# Patient Record
Sex: Male | Born: 1970 | Race: White | Hispanic: No | Marital: Single | Smoking: Never smoker
Health system: Southern US, Community
[De-identification: ages and names within clinical notes are randomized; demographics above are authoritative.]

## PROBLEM LIST (undated history)

## (undated) DIAGNOSIS — R569 Unspecified convulsions: Secondary | ICD-10-CM

## (undated) DIAGNOSIS — F32A Depression, unspecified: Secondary | ICD-10-CM

## (undated) DIAGNOSIS — F329 Major depressive disorder, single episode, unspecified: Secondary | ICD-10-CM

## (undated) DIAGNOSIS — M549 Dorsalgia, unspecified: Secondary | ICD-10-CM

## (undated) DIAGNOSIS — K5792 Diverticulitis of intestine, part unspecified, without perforation or abscess without bleeding: Secondary | ICD-10-CM

## (undated) DIAGNOSIS — K219 Gastro-esophageal reflux disease without esophagitis: Secondary | ICD-10-CM

## (undated) DIAGNOSIS — F419 Anxiety disorder, unspecified: Secondary | ICD-10-CM

## (undated) DIAGNOSIS — E785 Hyperlipidemia, unspecified: Secondary | ICD-10-CM

## (undated) DIAGNOSIS — G4730 Sleep apnea, unspecified: Secondary | ICD-10-CM

## (undated) HISTORY — DX: Hyperlipidemia, unspecified: E78.5

## (undated) HISTORY — DX: Anxiety disorder, unspecified: F41.9

## (undated) HISTORY — DX: Sleep apnea, unspecified: G47.30

## (undated) HISTORY — DX: Unspecified convulsions: R56.9

## (undated) HISTORY — PX: COLONOSCOPY: SHX174

## (undated) HISTORY — DX: Diverticulitis of intestine, part unspecified, without perforation or abscess without bleeding: K57.92

## (undated) HISTORY — PX: JOINT REPLACEMENT: SHX530

## (undated) HISTORY — DX: Major depressive disorder, single episode, unspecified: F32.9

## (undated) HISTORY — PX: UPPER GASTROINTESTINAL ENDOSCOPY: SHX188

## (undated) HISTORY — DX: Depression, unspecified: F32.A

---

## 1999-08-22 ENCOUNTER — Ambulatory Visit (HOSPITAL_COMMUNITY): Admission: RE | Admit: 1999-08-22 | Discharge: 1999-08-22 | Payer: Self-pay | Admitting: Family Medicine

## 1999-08-22 ENCOUNTER — Encounter: Payer: Self-pay | Admitting: Family Medicine

## 2001-03-04 ENCOUNTER — Ambulatory Visit: Admission: RE | Admit: 2001-03-04 | Discharge: 2001-03-04 | Payer: Self-pay | Admitting: Family Medicine

## 2001-03-04 ENCOUNTER — Encounter: Payer: Self-pay | Admitting: Family Medicine

## 2001-07-21 ENCOUNTER — Ambulatory Visit (HOSPITAL_BASED_OUTPATIENT_CLINIC_OR_DEPARTMENT_OTHER): Admission: RE | Admit: 2001-07-21 | Discharge: 2001-07-21 | Payer: Self-pay | Admitting: Family Medicine

## 2002-05-08 HISTORY — PX: OTHER SURGICAL HISTORY: SHX169

## 2003-02-18 ENCOUNTER — Ambulatory Visit (HOSPITAL_BASED_OUTPATIENT_CLINIC_OR_DEPARTMENT_OTHER): Admission: RE | Admit: 2003-02-18 | Discharge: 2003-02-19 | Payer: Self-pay | Admitting: Orthopedic Surgery

## 2003-09-10 ENCOUNTER — Ambulatory Visit (HOSPITAL_COMMUNITY): Admission: RE | Admit: 2003-09-10 | Discharge: 2003-09-10 | Payer: Self-pay | Admitting: Family Medicine

## 2003-11-30 ENCOUNTER — Ambulatory Visit (HOSPITAL_COMMUNITY): Admission: RE | Admit: 2003-11-30 | Discharge: 2003-11-30 | Payer: Self-pay | Admitting: Gastroenterology

## 2004-06-06 ENCOUNTER — Ambulatory Visit (HOSPITAL_COMMUNITY): Admission: RE | Admit: 2004-06-06 | Discharge: 2004-06-06 | Payer: Self-pay | Admitting: Family Medicine

## 2005-06-14 ENCOUNTER — Ambulatory Visit (HOSPITAL_COMMUNITY): Admission: RE | Admit: 2005-06-14 | Discharge: 2005-06-14 | Payer: Self-pay | Admitting: Gastroenterology

## 2007-05-10 ENCOUNTER — Ambulatory Visit: Payer: Self-pay | Admitting: Gastroenterology

## 2007-05-13 ENCOUNTER — Ambulatory Visit: Payer: Self-pay | Admitting: Gastroenterology

## 2007-05-13 DIAGNOSIS — K5900 Constipation, unspecified: Secondary | ICD-10-CM | POA: Insufficient documentation

## 2007-05-13 DIAGNOSIS — K210 Gastro-esophageal reflux disease with esophagitis: Secondary | ICD-10-CM

## 2007-05-13 DIAGNOSIS — K5732 Diverticulitis of large intestine without perforation or abscess without bleeding: Secondary | ICD-10-CM | POA: Insufficient documentation

## 2007-05-13 DIAGNOSIS — K449 Diaphragmatic hernia without obstruction or gangrene: Secondary | ICD-10-CM | POA: Insufficient documentation

## 2007-06-17 ENCOUNTER — Ambulatory Visit: Payer: Self-pay | Admitting: Gastroenterology

## 2009-05-25 ENCOUNTER — Ambulatory Visit: Payer: Self-pay | Admitting: Gastroenterology

## 2009-05-25 DIAGNOSIS — R1012 Left upper quadrant pain: Secondary | ICD-10-CM

## 2009-05-26 ENCOUNTER — Ambulatory Visit (HOSPITAL_COMMUNITY): Admission: RE | Admit: 2009-05-26 | Discharge: 2009-05-26 | Payer: Self-pay | Admitting: Gastroenterology

## 2009-05-26 LAB — CONVERTED CEMR LAB
ALT: 23 units/L (ref 0–53)
AST: 20 units/L (ref 0–37)
Albumin: 4.3 g/dL (ref 3.5–5.2)
Basophils Relative: 0.8 % (ref 0.0–3.0)
GFR calc non Af Amer: 88 mL/min (ref >60)
Glucose, Bld: 79 mg/dL (ref 70–99)
Lymphocytes Relative: 29.2 % (ref 12.0–46.0)
MCHC: 33 g/dL (ref 30.0–36.0)
MCV: 93.2 fL (ref 78.0–100.0)
Monocytes Relative: 9.3 % (ref 3.0–12.0)
Neutro Abs: 3.8 10*3/uL (ref 1.4–7.7)
Neutrophils Relative %: 57.6 % (ref 43.0–77.0)
Potassium: 4.8 meq/L (ref 3.5–5.1)
Sed Rate: 12 mm/hr (ref 0–22)
TSH: 2.29 microintl units/mL (ref 0.35–5.50)
Total Protein: 7.1 g/dL (ref 6.0–8.3)

## 2009-06-28 ENCOUNTER — Ambulatory Visit: Payer: Self-pay | Admitting: Gastroenterology

## 2010-06-09 NOTE — Assessment & Plan Note (Signed)
  Review of gastrointestinal problems: 1. Chronic constipation:  colonoscopy January 2009 showing left side diverticulosis.  No adenomas.  Symptoms somewhat relieved with MiraLax daily.   2. Chronic abdominal discomfort and bloating, intermittent dysphagia. Status post EGD January 2009 showing a 3 cm hiatal hernia, reflux related esophagitis and a lower esophageal Schatzki's type ring dilated to 20 mm with a CRE balloon. Ultrasound January, 2011: normal gallbladder, no gallstones, ?hyperechoic liver.  Labs in January, 2011: normal CBC, normal complete metabolic profile, normal thyroid testing, normal sedimentation rate. 3. routine risk for colon cancer, next colonoscopy January 2019    History of Present Illness Visit Type: Follow-up Visit Primary GI MD: Rob Bunting MD Primary Provider: Holley Bouche, MD Chief Complaint: constipation History of Present Illness:     whom I last saw abouit 1 month ago.  He started to have loose stools shortly after starting miralax/citrucel after 3 days.  Stopped and became somwhat constipated again.  HE prophylactally due to community wide stomach flu (with has affected his GF's daughter and GF, mainly just coughing, nausea).  Had brief, sharp, stabbing epigastric pain.  No associated with eating. Did not last very long.  Has never happened before.  Had no associate N/V.            Current Medications (verified): 1)  Miralax  Powd (Polyethylene Glycol 3350) .... As Needed 2)  Prevacid 15 Mg Cpdr (Lansoprazole) .... Take 1 - 2 Capsules By Mouth Daily As Needed 3)  Tramadol Hcl 50 Mg Tabs (Tramadol Hcl) .... At Bedtime 4)  Lyrica 25 Mg Caps (Pregabalin) .... Two Times A Day 5)  Etodolac 300 Mg Caps (Etodolac) .... Take One Pill Twice A Day As Needed  Allergies (verified): No Known Drug Allergies  Vital Signs:  Patient profile:   40 year old male Height:      70 inches Weight:      229.50 pounds BMI:     33.05 Pulse rate:   72 / minute Pulse  rhythm:   regular BP sitting:   120 / 84  (left arm) Cuff size:   regular  Vitals Entered By: June McMurray CMA Duncan Dull) (June 28, 2009 9:49 AM)  Physical Exam  Additional Exam:  Constitutional: generally well appearing Psychiatric: alert and oriented times 3 Abdomen: soft, non-tender, non-distended, normal bowel sounds    Impression & Recommendations:  Problem # 1:  constipation, somewhat improved I recommended he stay on something on a daily basis such as MiraLax, fiber supplements. In my mind this is better than trying to treat constipation after it is already become significant.  Problem # 2:  epigastric pain unclear etiology however his pain only occurred once. He has no gallstones on recent ultrasound and his CBC complete metabolic profile was normal. He does take NSAIDs and so perhaps this is NSAID related. He will call his pain becomes more frequent or more bothersome.  Patient Instructions: 1)  Try taking smaller doses of miralax and fiber supplment (every day).  Start with 1/2 dose of each, every day. 2)  Call Dr. Christella Hartigan if this isn't effective or if the epigastric pains occur more frequently (would consider HIDA scan of GB function). 3)  A copy of this information will be sent to Dr. Holley Bouche. 4)  The medication list was reviewed and reconciled.  All changed / newly prescribed medications were explained.  A complete medication list was provided to the patient / caregiver.

## 2010-06-09 NOTE — Assessment & Plan Note (Signed)
Review of gastrointestinal problems: 1. Chronic constipation:  colonoscopy January 2009 showing left side diverticulosis.  No adenomas.  Symptoms somewhat relieved with MiraLax daily.   2. Chronic abdominal discomfort and bloating, intermittent dysphagia. Status post EGD January 2009 showing a 3 cm hiatal hernia, reflux related esophagitis and a lower esophageal Schatzki's type ring dilated to 20 mm with a CRE balloon.  3. routine risk for colon cancer, next colonoscopy January 2019   History of Present Illness Visit Type: Follow-up Visit Primary GI MD: Rob Bunting MD Primary Provider: Holley Bouche, MD Chief Complaint: abdominal pain LUQ History of Present Illness:     very pleasant 40 year old man whom I last saw two years ago.  At that point he was put on a trial of low dose Reglan, this helped somewhat.  he was scheduled to see me again in 4-5 weeks after that but he did not show for that appointment and I have not heard from him since then.   Currently he is most bothered by bloating sensation that occur usually right after he eats.  Will have knot like feeling in LUQ, bloating.  Will not get much nausea. No vomitting.  He takes prevacid daily, if he skips then he will get pyrosis.  has gained 11 pounds since last visit.  eats 3 meals a day.  Doesn't matter what he eats, can have the bloating sensation.  Will take miralax as needed only.  May have tried fiber in the past, not sure if it helped.             Current Medications (verified): 1)  Miralax  Powd (Polyethylene Glycol 3350) .... As Needed 2)  Prevacid 15 Mg Cpdr (Lansoprazole) .... Take 1 - 2 Capsules By Mouth Daily As Needed 3)  Tramadol Hcl 50 Mg Tabs (Tramadol Hcl) .... At Bedtime 4)  Lyrica 25 Mg Caps (Pregabalin) .... Two Times A Day  Allergies (verified): No Known Drug Allergies  Vital Signs:  Patient profile:   40 year old male Height:      70 inches Weight:      231.13 pounds BMI:     33.28 Pulse  rate:   88 / minute Pulse rhythm:   regular BP sitting:   118 / 98  (left arm) Cuff size:   regular  Vitals Entered By: June McMurray CMA Duncan Dull) (May 25, 2009 10:24 AM)  Physical Exam  Additional Exam:  Constitutional: generally well appearing Psychiatric: alert and oriented times 3 Abdomen: soft, non-tender, non-distended, normal bowel sounds    Impression & Recommendations:  Problem # 1:  CONSTIPATION (ICD-564.00) I did not mention above that he does have to push and strain to move his bowels, usually. He has had a colonoscopy and that does not need to be repeated. I recommended he start taking MiraLax and fiber supplements both on a daily basis. Constipation can contribute or cause upper GI symptoms such as his, bloating, dyspepsia. It will be interesting to see if his symptoms resolve after 4-5 weeks of working on his constipation.  Problem # 2:  dyspepsia possibly constipation related, he has not had any imaging studies and so I will set him up with an abdominal ultrasound. He will also get a repeat set of labs including a CBC, complete metabolic profile, sedimentation rate, thyroid testing.  Other Orders: TLB-CBC Platelet - w/Differential (85025-CBCD) TLB-CMP (Comprehensive Metabolic Pnl) (80053-COMP) TLB-TSH (Thyroid Stimulating Hormone) (84443-TSH) TLB-Sedimentation Rate (ESR) (85652-ESR)  Patient Instructions: 1)  You will get lab test(s)  done today (cbc, cmet, esr, tsh).  2)  Continue taking miralax one capful a day. 3)  You should begin taking citrucel powder fiber supplement (orange flavor).  Start with a small spoonful and increase this over 1 week to a full, heaping spoonful daily.  You may notice some bloating when you first start the fiber, but that usually resolves after a few days. 4)  You will be scheduled for an ultrasound for abd discomfort (LUQ). 5)  Return to see Dr. Christella Hartigan in 4-5 weeks. 6)  The medication list was reviewed and reconciled.  All changed /  newly prescribed medications were explained.  A complete medication list was provided to the patient / caregiver.  Appended Document: Orders Update/US    Clinical Lists Changes  Problems: Added new problem of ABDOMINAL PAIN, LEFT UPPER QUADRANT (ICD-789.02) Orders: Added new Test order of Ultrasound Abdomen (UAS) - Signed

## 2010-09-20 NOTE — Assessment & Plan Note (Signed)
[facility]                         GASTROENTEROLOGY OFFICE NOTE   NAME:Daniel Morrison, Daniel Morrison                            MRN:          [id number]  DATE:05/10/2007                            DOB:          02-22-1971    REFERRING PHYSICIAN:  Melida Quitter, M.D.   REASON FOR REFERRAL:  Dr. Tiburcio Pea asked me to evaluate Daniel Morrison in  consultation regarding chronic abdominal discomfort, bloating,  constipation.   HISTORY OF PRESENT ILLNESS:  Daniel Morrison is a very pleasant 40 year old man  who has had at least 2 to 3 years of bloating sensation.  He has chronic  GERD symptoms as well, pyrosis, which are usually well controlled by  Protonix, which he takes once to twice a day.  He did have an EGD by Dr.  Dorena Cookey in July 2008, and it showed a large hiatal hernia that  measured 4 to 5 cm, as well as an inflamed lower esophageal ring that he  dilated.  Daniel Morrison believes that his intermittent dysphagia improved  after the dilation.   He also has rather chronic constipation.  He will have to strain to move  his bowels.  He has seen blood on intermittent occasions.  He never has  diarrhea.   REVIEW OF SYSTEMS:  Notable for a 10-pound weight gain in the past year.  Otherwise, essentially normal and available on his nursing intake sheet.   PAST MEDICAL HISTORY:  GERD and hiatal hernia, as above.   CURRENT MEDICATIONS:  1. Protonix 40 mg once to twice daily.  2. Goody's Powders he takes on as needed basis, which is about every      other day.  3. MiraLax he takes 1 scoop every day or 2.   ALLERGIES:  No known drug allergies.   SOCIAL HISTORY:  Divorced.  No children.  Works as an Oncologist.  Does not smoke cigarettes.  Drinks 2 to 3 beers a night.  Drinks 64  ounces of Diet Dr. Reino Kent a day and 40 ounces of coffee a day.   FAMILY HISTORY:  His maternal grandfather had colon cancer.  No first  degree relatives with colon cancer.   PHYSICAL EXAM:  Height 5  feet 11 inches, 220 pounds.  Blood pressure  126/84, pulse 80.  CONSTITUTIONAL:  Generally well-appearing.  NEUROLOGIC:  Alert and oriented x3.  EYES:  Extraocular movements intact.  MOUTH:  Oropharynx moist.  No lesions.  NECK:  Supple.  No lymphadenopathy.  CARDIOVASCULAR:  Heart is regular rate and rhythm.  LUNGS:  Clear to auscultation bilaterally.  ABDOMEN:  Soft, nontender, nondistended.  Normal bowel sounds.  EXTREMITIES:  No lower extremity edema.  SKIN:  No rashes or lesions on the visible extremities.   ASSESSMENT AND PLAN:  A 40 year old man with large hiatal hernia,  chronic gastroesophageal reflux disease symptoms, chronic constipation,  bloating discomfort.   First, for his constipation, I recommended he continue the MiraLax and  take it on an every day basis rather than just every other day or so.  He can feel free to  increase to 1-1/2 scoops a day on a daily basis.  He  has never had colonoscopy, but has seen bright red blood per rectum, so  I will arrange for him to have full colonoscopy performed at his soonest  convenience as well.   As per his upper GI symptoms, he has a large hiatal hernia that is  likely contributing to his GERD symptoms.  He also drinks multiple  caffeinated beverages a day, 2 to 3 alcoholic beverages a day, and  Goody's Powders every other day that has caffeine and NSAID in it.  I  recommended very strongly that he cut his caffeine intake back to at  least just 1 to 2 caffeinated beverages a day and also try to cut his  alcohol intake down to just 1 beer nightly.  I think these will go a  long way to helping his GERD symptoms and bloating.  He does have  intermittent dysphagia, and has had an esophageal stricture with a ring  dilated in 2005, so I will arrange to have an EGD again at the same time  as his colonoscopy.  I see no reason for any further blood tests or  imaging studies other than those mentioned above.     Rachael Fee,  MD  Electronically Signed    DPJ/MedQ  DD: 05/10/2007  DT: 05/10/2007  Job #: [id number]   cc:   Melida Quitter, M.D.

## 2010-09-20 NOTE — Assessment & Plan Note (Signed)
[facility]                         GASTROENTEROLOGY OFFICE NOTE   NAME:Daniel Morrison, Daniel Morrison                            MRN:          [id number]  DATE:06/17/2007                            DOB:          1970/12/17    GASTROINTESTINAL PROBLEM LIST:  1. Chronic constipation status post colonoscopy January 2009 showing      left side diverticulosis.  No adenomas.  Symptoms somewhat relieved      with MiraLax daily.  2. Chronic abdominal discomfort and bloating, intermittent dysphagia.      Status post EGD January 2009 showing a 3 cm hiatal hernia, reflux      related esophagitis and a lower esophageal Schatzki's type ring      dilated to 20 mm with a CRE balloon.   INTERVAL HISTORY:  I last saw Daniel Morrison at the time of his colonoscopy  and upper endoscopy  about five weeks ago.  The results of those are  summarized above.  Since then, he still does have intermittent  dysphagia like symptoms, but now when he is describing them, it seems  more like he will have regurgitation one to two hours after eating a  meal.  It can be a small amount of relatively undigested food.  He does  have bloating and mild, chronic nausea as well.   CURRENT MEDICATIONS:  Protonix 40 mg once a day.  Some days takes twice  a day.   PHYSICAL EXAMINATION:  Weight 219 pounds, which is down 1 pound since  his last visit.  Blood pressure 120/78, pulse 64.  CONSTITUTIONAL:  Generally well appearing.  ABDOMEN:  Soft, nontender, nondistended.  Normal bowel sounds.  EXTREMITIES:  No lower extremity edema.   ASSESSMENT/PLAN:  [age]-year-old man with mild, chronic  constipation, intermittent vomiting, history of Schatzki's ring and mild  gastroesophageal reflux disease related esophagitis.   For his constipation, he has never really tried fiber supplements and  recommended he try that instead of MiraLax as MiraLax can be quite  expensive and with fiber supplements, he could get just sa  much relief.  The way he is describing his upper gastrointestinal symptoms, it is more  of a regurgitation now, and I wonder if he has minor gastroparesis.  He  is not diabetic and I see no reason for him to have slow stomach  emptying, but I will try him on a low dose trial of Reglan 5 mg t.i.d.  that I will call in a prescription for one month's worth with one  refill.  He will return to see me in four weeks' time and sooner if  needed.  I see no reason for any further blood tests or imaging studies  at this point.     Rachael Fee, MD  Electronically Signed   DPJ/MedQ  DD: 06/17/2007  DT: 06/18/2007  Job #: [id number]   cc:   Melida Quitter, M.D.

## 2010-09-23 NOTE — Op Note (Signed)
NAMEROWYN, SPILDE NO.:  [id number]   MEDICAL RECORD NO.:  [id number]                   PATIENT TYPE:  AMB   LOCATION:  DSC                                  FACILITY:  [facility]   PHYSICIAN:  Loreta Ave, M.D.              DATE OF BIRTH:  [birth year]   DATE OF PROCEDURE:  02/18/2003  DATE OF DISCHARGE:                                 OPERATIVE REPORT   PREOPERATIVE DIAGNOSES:  1. Torn medial collateral ligament, anterior cruciate ligament and partial     lateral collateral ligament tear, right knee.  2. Lateral meniscal tear.  3. Closed treatment of medial and lateral collateral injuries with     significant healing.  4. Persistent anterolateral rotary instability.   POSTOPERATIVE DIAGNOSES:  1. Torn medial collateral ligament, anterior cruciate ligament and partial     lateral collateral ligament tear, right knee.  2. Lateral meniscal tear.  3. Closed treatment of medial and lateral collateral injuries with     significant healing.  4. Persistent anterolateral rotary instability.  5. Healed lateral collateral ligament injury and 80% healing medial     collateral ligament injury.   PROCEDURE:  1. Right knee examination under anesthesia.  2. Arthroscopy.  3. Partial lateral meniscectomy.  4. Arthroscopic endoscopic anterior cruciate ligament reconstruction.  5. Patellar tendon allograft.  6. Bone-tendon-bone with bioabsorbable screw fixation and notchplasty.   SURGEON:  Loreta Ave, M.D.   ASSISTANT:  Arlys John D. Petrarca, P.A.-C.   ANESTHESIA:  General.   ESTIMATED BLOOD LOSS:  Minimal.   TOURNIQUET TIME:  One hour.   SPECIMENS:  None.   CULTURES:  None.   COMPLICATIONS:  None.   DRESSING:  Soft compressive with Bledsoe brace in full extension.   DESCRIPTION OF PROCEDURE:  The patient was brought to the operating room and  after adequate anesthesia had been obtained, the right knee examined.  Still  open slightly with  soft end point, medial collateral ligament testing and  flexion and extension.  No longer grossly unstable as he was after his  injury.  Lateral collateral ligament stable.  Positive Lachman, positive  drawer, positive pivot shift.  Patellofemoral joint had a little medial  laxity but basically stable after medial retinacular injury had been treated  closed.  Full extension.  With gentle motion, achieved full flexion.  Tourniquet applied.  Leg prepped and draped in the usual sterile fashion.  Exsanguinated with elevation and Esmarch.  Tourniquet inflated to 350 mmHg.   Three portals were created; one superolateral, one each medial and lateral  parapatellar.  Inflow catheter introduced.  Knee distended.  Arthroscope  introduced, knee inspected.  Some grade 2 scuffing changes on the patella  treated with chondroplasty.  A little lateral tracking, no tethering.  Sufficient stability.  Remaining articular cartilage intact.  Irreparable  large flap tear posterior half lateral meniscus, saucerized  out, tapered in  smoothly.  Soft tissue injury to the popliteal tendon and posterolateral  corner sufficient healing with no open injuries or instability there.  Medial meniscus intact.  The peripheral medial meniscal injury had healed.  ACL completely deficient.  ACL debrided.  Notchplasty with a shaver and high  speed bur.   Allograft prepared for 10 mm tunnels.  Small incision next to the tibial  tubercle, K-wire driven and then overdrilled with a 10 mm reamer to create  the tibial tunnel in the ACL footprint.  The femoral guide inserted across  the tibial/femoral notch back to the cortex of the femur.  K-wire driven and  overdrilled with a reamer for appropriate depth for the graft and pegs.  Tunnels assessed, found to be in good position.  Debris cleared throughout.   Two pin passers inserted across both tunnels and out through a stab wound  anterolateral thigh.  Nitinol wire brought in the  medial portal and out  through the femoral tunnel.  Graft attached to a two pin passer and pulled  across the knee seating the pegs well in the tibia and in the femur.  Firmly  fixed in both tunnels over nitinol wire with appropriate tensioning.  At  completion, excellent capturing and fixation of the graft above and below.  Full motion without impingement on the graft through full motion.  Negative  Lachman and drawer.  Marked improvement of stability.   Entire knee examined, no other findings appreciated.  Instruments and fluid  removed.  Portals of the knee injected with Marcaine.  Portals were closed  with 4-0 nylon.  Incision injected with Marcaine and closed subcutaneous and  subcuticular Vicryl.  Sterile compressive dressing applied.  Tourniquet  deflated and removed.  Bledsoe brace applied in full extension.  Anesthesia  reversed.   Brought to recovery room.  Tolerated surgery well.  No complications.                                               Loreta Ave, M.D.    DFM/MEDQ  D:  02/18/2003  T:  02/18/2003  Job:  [id number]

## 2010-09-23 NOTE — Op Note (Signed)
Daniel Morrison, Daniel Morrison                               ACCOUNT NO.:  [id number]   MEDICAL RECORD NO.:  [id number]                   PATIENT TYPE:  AMB   LOCATION:  ENDO                                 FACILITY:  [facility]   PHYSICIAN:  John C. Madilyn Fireman, M.D.                 DATE OF BIRTH:  Jul 21, 1970   DATE OF PROCEDURE:  11/30/2003  DATE OF DISCHARGE:                                 OPERATIVE REPORT   PROCEDURE:  Esophagogastroduodenoscopy with esophageal dilatation.   INDICATION FOR PROCEDURE:  Postprandial dyspepsia and possible solid food  dysphagia.   PROCEDURE:  The patient was placed in the left lateral decubitus position  and placed on the pulse monitor with a continuous low-flow oxygen delivered  by nasal cannula.  He was sedated with 75 mcg IV fentanyl and 8 mg IV  Versed.  The Olympus video endoscope was advanced under direct vision into  the oropharynx and esophagus.  The esophagus was somewhat tortuous but of  normal caliber with the squamocolumnar line at 36 cm above a 4-5 cm hiatal  hernia.  There was circumferential inflammation and narrowing at the  squamocolumnar line consistent with an inflamed lower esophageal ring.  There were no discrete ulcers but circumferential inflammation with some  exudate.  There was no resistance to the passage of the scope beyond the  ring.  The stomach was entered and a small amount of liquid secretions was  suctioned from the fundus.  Retroflexed view of the cardia confirmed the  hiatal hernia and was otherwise unremarkable.  It was difficult for the  patient to hold air, and there was free reflux seen into the esophagus  during the procedure.  The fundus, body, antrum, and pylorus all appeared  normal.  The duodenum was entered and both the bulb and second portion were  well-inspected and appeared to be within normal limits.  A Savary guidewire  was placed through the endoscope channel and the scope withdrawn.  A 16 mm  Savary dilator was passed  over the guidewire with minimal resistance and no  blood seen on withdrawal.  The dilator was removed together with a wire and  the patient returned to the recovery room in stable condition.  He tolerated  the procedure well, and there were no immediate complications.   IMPRESSION:  1. Inflamed lower esophageal ring.  2. Large hiatal hernia with reflux.   PLAN:  Will increase proton pump inhibitor to b.i.d. dosing, reinforce  lifestyle modifications for reflux, and observe response to dilatation.  Follow-up in my office in six weeks.                                               John C. Madilyn Fireman, M.D.    JCH/MEDQ  D:  11/30/2003  T:  11/30/2003  Job:  [id number]   cc:   Meredith Staggers, M.D.  [address], [address]  [place]  [place] [address]  Fax: [phone]

## 2011-03-01 ENCOUNTER — Emergency Department (HOSPITAL_BASED_OUTPATIENT_CLINIC_OR_DEPARTMENT_OTHER)
Admission: EM | Admit: 2011-03-01 | Discharge: 2011-03-01 | Disposition: A | Discharge status: Home / Self Care | Payer: 59 | Attending: Emergency Medicine | Admitting: Emergency Medicine

## 2011-03-01 ENCOUNTER — Emergency Department (INDEPENDENT_AMBULATORY_CARE_PROVIDER_SITE_OTHER): Payer: 59

## 2011-03-01 ENCOUNTER — Encounter: Payer: Self-pay | Admitting: *Deleted

## 2011-03-01 DIAGNOSIS — N3090 Cystitis, unspecified without hematuria: Secondary | ICD-10-CM | POA: Insufficient documentation

## 2011-03-01 DIAGNOSIS — K5792 Diverticulitis of intestine, part unspecified, without perforation or abscess without bleeding: Secondary | ICD-10-CM

## 2011-03-01 DIAGNOSIS — R300 Dysuria: Secondary | ICD-10-CM

## 2011-03-01 DIAGNOSIS — R109 Unspecified abdominal pain: Secondary | ICD-10-CM | POA: Insufficient documentation

## 2011-03-01 DIAGNOSIS — K219 Gastro-esophageal reflux disease without esophagitis: Secondary | ICD-10-CM | POA: Insufficient documentation

## 2011-03-01 DIAGNOSIS — K5732 Diverticulitis of large intestine without perforation or abscess without bleeding: Secondary | ICD-10-CM | POA: Insufficient documentation

## 2011-03-01 DIAGNOSIS — K5730 Diverticulosis of large intestine without perforation or abscess without bleeding: Secondary | ICD-10-CM

## 2011-03-01 HISTORY — DX: Gastro-esophageal reflux disease without esophagitis: K21.9

## 2011-03-01 HISTORY — DX: Dorsalgia, unspecified: M54.9

## 2011-03-01 LAB — URINALYSIS, ROUTINE W REFLEX MICROSCOPIC
Leukocytes, UA: NEGATIVE
Nitrite: NEGATIVE
Specific Gravity, Urine: 1.021 (ref 1.005–1.030)
Urobilinogen, UA: 0.2 mg/dL (ref 0.0–1.0)
pH: 5.5 (ref 5.0–8.0)

## 2011-03-01 LAB — COMPREHENSIVE METABOLIC PANEL
ALT: 24 U/L (ref 0–53)
Alkaline Phosphatase: 52 U/L (ref 39–117)
CO2: 25 mEq/L (ref 19–32)
Calcium: 9.7 mg/dL (ref 8.4–10.5)
Creatinine, Ser: 0.8 mg/dL (ref 0.50–1.35)
GFR calc Af Amer: >90 mL/min (ref >90)
Glucose, Bld: 86 mg/dL (ref 70–99)
Potassium: 3.7 mEq/L (ref 3.5–5.1)

## 2011-03-01 LAB — CBC
HCT: 39.9 % (ref 39.0–52.0)
MCHC: 35.8 g/dL (ref 30.0–36.0)
MCV: 86.6 fL (ref 78.0–100.0)
RDW: 12.6 % (ref 11.5–15.5)

## 2011-03-01 LAB — DIFFERENTIAL
Monocytes Absolute: 0.8 10*3/uL (ref 0.1–1.0)
Monocytes Relative: 9 % (ref 3–12)

## 2011-03-01 LAB — LIPASE, BLOOD: Lipase: 28 U/L (ref 11–59)

## 2011-03-01 MED ORDER — MORPHINE SULFATE 4 MG/ML IJ SOLN
4.0000 mg | Freq: Once | INTRAMUSCULAR | Status: AC
  Administered 2011-03-01: 4 mg via INTRAVENOUS
  Filled 2011-03-01: qty 1

## 2011-03-01 MED ORDER — METRONIDAZOLE 500 MG PO TABS: 500.0000 mg | ORAL_TABLET | Freq: Two times a day (BID) | ORAL | Status: AC

## 2011-03-01 MED ORDER — ONDANSETRON HCL 4 MG/2ML IJ SOLN
4.0000 mg | Freq: Once | INTRAMUSCULAR | Status: AC
  Administered 2011-03-01: 4 mg via INTRAVENOUS
  Filled 2011-03-01: qty 2

## 2011-03-01 MED ORDER — IOHEXOL 300 MG/ML  SOLN
100.0000 mL | Freq: Once | INTRAMUSCULAR | Status: AC | PRN
  Administered 2011-03-01: 100 mL via INTRAVENOUS

## 2011-03-01 MED ORDER — CIPROFLOXACIN HCL 500 MG PO TABS: 500.0000 mg | ORAL_TABLET | Freq: Two times a day (BID) | ORAL | Status: AC

## 2011-03-01 NOTE — ED Notes (Signed)
Pt c/o lower abd pain with painful urination

## 2011-03-01 NOTE — ED Provider Notes (Signed)
History     CSN: [id number] Arrival date & time: 03/01/2011  7:04 PM   First MD Initiated Contact with Patient 03/01/11 1907      Chief Complaint  Patient presents with  . Dysuria    (Consider location/radiation/quality/duration/timing/severity/associated sxs/prior treatment) HPI Comments: Pt states that he has been having generalized lower abdominal pain with urination  Patient is a 40 y.o. male presenting with abdominal pain. The history is provided by the patient. No language interpreter was used.  Abdominal Pain The primary symptoms of the illness include abdominal pain, nausea, diarrhea and dysuria. The primary symptoms of the illness do not include vomiting. The current episode started 2 days ago. The onset of the illness was gradual. The problem has been gradually worsening.  The dysuria is associated with frequency. The dysuria is not associated with urgency.  Additional symptoms associated with the illness include frequency. Symptoms associated with the illness do not include constipation, urgency or back pain.    Past Medical History  Diagnosis Date  . GERD (gastroesophageal reflux disease)   . Back pain     Past Surgical History  Procedure Date  . Joint replacement     History reviewed. No pertinent family history.  History  Substance Use Topics  . Smoking status: Never Smoker   . Smokeless tobacco: Not on file  . Alcohol Use: 3.0 oz/week    5 Cans of beer per week      Review of Systems  Gastrointestinal: Positive for nausea, abdominal pain and diarrhea. Negative for vomiting and constipation.  Genitourinary: Positive for dysuria and frequency. Negative for urgency.  Musculoskeletal: Negative for back pain.  All other systems reviewed and are negative.    Allergies  Review of patient's allergies indicates no known allergies.  Home Medications   Current Outpatient Rx  Name Route Sig Dispense Refill  . HYDROCODONE-ACETAMINOPHEN 5-325 MG PO TABS  Oral Take 1 tablet by mouth every 6 (six) hours as needed.      Marland Kitchen OMEPRAZOLE 20 MG PO CPDR Oral Take 20 mg by mouth daily.        BP 132/93  Pulse 75  Temp(Src) 98.3 F (36.8 C) (Oral)  Resp 16  Ht 5\' 11"  (1.803 m)  Wt 198 lb (89.812 kg)  BMI 27.62 kg/m2  SpO2 100%  Physical Exam  Nursing note and vitals reviewed. Constitutional: He is oriented to person, place, and time. He appears well-developed and well-nourished.  Cardiovascular: Normal rate and regular rhythm.   Pulmonary/Chest: Effort normal and breath sounds normal.  Abdominal: Soft. There is tenderness in the right lower quadrant and left lower quadrant.  Musculoskeletal: Normal range of motion.  Neurological: He is alert and oriented to person, place, and time.  Skin: Skin is warm and dry.    ED Course  Procedures (including critical care time)  Labs Reviewed  URINALYSIS, ROUTINE W REFLEX MICROSCOPIC - Abnormal; Notable for the following:    Ketones, ur 15 (*)    All other components within normal limits  CBC  DIFFERENTIAL  COMPREHENSIVE METABOLIC PANEL  LIPASE, BLOOD   Ct Abdomen Pelvis W Contrast  03/01/2011  *RADIOLOGY REPORT*  Clinical Data: Abdominal pain and dysuria.  CT ABDOMEN AND PELVIS WITH CONTRAST  Technique:  Multidetector CT imaging of the abdomen and pelvis was performed following the standard protocol during bolus administration of intravenous contrast.  Contrast: OMNIPAQUE IOHEXOL 300 MG/ML IV SOLN  Comparison: 05/26/2009 ultrasound.  05/1934 CT  Findings: Clear lung bases.  Size upper normal without pericardial or pleural effusion.  Mild atrophy of the lateral segment left liver lobe is unchanged and nonspecific.  Normal spleen, stomach, pancreas, gallbladder, biliary tract, adrenal glands, kidneys. No retroperitoneal or retrocrural adenopathy.  Sigmoid diverticula with muscular hypertrophy.  Possible pericolonic edema at the descending sigmoid junction, including on image 63. Normal terminal  ileum and appendix.  Normal small bowel without abdominal ascites.  Tiny fat containing left inguinal hernia. No pelvic adenopathy. The bladder wall appears mildly irregular on image 80.  Normal prostate without significant free pelvic fluid.  Right-sided transitional lumbosacral anatomy.  IMPRESSION:  1.  Mild bladder wall irregularity suspected.  Given history of dysuria, this could represent cystitis. 2.  Sigmoid diverticulosis with equivocal pericolonic edema (versus prominent pericolonic vasculature).  Correlate with any symptoms to suggest minimal uncomplicated diverticulitis.  Of note, this appearance was similar on 06/06/2004 CT.  Original Report Authenticated By: Consuello Bossier, M.D.     1. Diverticulitis   2. Cystitis       MDM  Urine is clean:will treat for a early diverticulitis as pt is having diarrhea and left sided pain:cipro will also treat cystitis:urine not showing infection        Teressa Lower, NP 03/01/11 2042

## 2011-03-02 NOTE — ED Provider Notes (Signed)
Medical screening examination/treatment/procedure(s) were performed by non-physician practitioner and as supervising physician I was immediately available for consultation/collaboration.   Dorien Bessent A. Jia Dottavio, MD 03/02/11 0002 

## 2011-04-06 ENCOUNTER — Ambulatory Visit
Admission: RE | Admit: 2011-04-06 | Discharge: 2011-04-06 | Disposition: A | Discharge status: Home / Self Care | Payer: 59 | Source: Ambulatory Visit | Attending: Family Medicine | Admitting: Family Medicine

## 2011-04-06 ENCOUNTER — Other Ambulatory Visit: Payer: Self-pay | Admitting: Family Medicine

## 2011-04-06 DIAGNOSIS — M25561 Pain in right knee: Secondary | ICD-10-CM

## 2011-12-13 ENCOUNTER — Ambulatory Visit
Admission: RE | Admit: 2011-12-13 | Discharge: 2011-12-13 | Disposition: A | Discharge status: Home / Self Care | Payer: 59 | Source: Ambulatory Visit | Attending: Family Medicine | Admitting: Family Medicine

## 2011-12-13 ENCOUNTER — Other Ambulatory Visit: Payer: Self-pay | Admitting: Family Medicine

## 2011-12-13 ENCOUNTER — Other Ambulatory Visit: Payer: 59

## 2011-12-13 DIAGNOSIS — R1031 Right lower quadrant pain: Secondary | ICD-10-CM

## 2011-12-13 DIAGNOSIS — R1032 Left lower quadrant pain: Secondary | ICD-10-CM

## 2011-12-13 MED ORDER — IOHEXOL 300 MG/ML  SOLN
100.0000 mL | Freq: Once | INTRAMUSCULAR | Status: AC | PRN
  Administered 2011-12-13: 100 mL via INTRAVENOUS

## 2011-12-24 ENCOUNTER — Emergency Department (HOSPITAL_BASED_OUTPATIENT_CLINIC_OR_DEPARTMENT_OTHER): Payer: 59

## 2011-12-24 ENCOUNTER — Emergency Department (HOSPITAL_BASED_OUTPATIENT_CLINIC_OR_DEPARTMENT_OTHER)
Admission: EM | Admit: 2011-12-24 | Discharge: 2011-12-24 | Disposition: A | Discharge status: Home / Self Care | Payer: 59 | Attending: Emergency Medicine | Admitting: Emergency Medicine

## 2011-12-24 ENCOUNTER — Encounter (HOSPITAL_BASED_OUTPATIENT_CLINIC_OR_DEPARTMENT_OTHER): Payer: Self-pay | Admitting: *Deleted

## 2011-12-24 DIAGNOSIS — K219 Gastro-esophageal reflux disease without esophagitis: Secondary | ICD-10-CM

## 2011-12-24 DIAGNOSIS — R109 Unspecified abdominal pain: Secondary | ICD-10-CM

## 2011-12-24 LAB — CBC WITH DIFFERENTIAL/PLATELET
Eosinophils Absolute: 0.1 10*3/uL (ref 0.0–0.7)
Hemoglobin: 14.4 g/dL (ref 13.0–17.0)
Lymphocytes Relative: 30 % (ref 12–46)
MCH: 31.4 pg (ref 26.0–34.0)
MCHC: 35.7 g/dL (ref 30.0–36.0)
MCV: 88 fL (ref 78.0–100.0)
Neutro Abs: 4.1 10*3/uL (ref 1.7–7.7)
Neutrophils Relative %: 59 % (ref 43–77)
Platelets: 277 10*3/uL (ref 150–400)
RBC: 4.58 MIL/uL (ref 4.22–5.81)

## 2011-12-24 LAB — COMPREHENSIVE METABOLIC PANEL
ALT: 45 U/L (ref 0–53)
AST: 31 U/L (ref 0–37)
Albumin: 3.9 g/dL (ref 3.5–5.2)
Alkaline Phosphatase: 48 U/L (ref 39–117)
BUN: 8 mg/dL (ref 6–23)
Calcium: 9.2 mg/dL (ref 8.4–10.5)
Chloride: 103 mEq/L (ref 96–112)
GFR calc Af Amer: >90 mL/min (ref >90)
GFR calc non Af Amer: >90 mL/min (ref >90)
Sodium: 139 mEq/L (ref 135–145)
Total Protein: 6.9 g/dL (ref 6.0–8.3)

## 2011-12-24 LAB — URINALYSIS, ROUTINE W REFLEX MICROSCOPIC
Glucose, UA: NEGATIVE mg/dL
Hgb urine dipstick: NEGATIVE
Ketones, ur: NEGATIVE mg/dL
Leukocytes, UA: NEGATIVE
pH: 5.5 (ref 5.0–8.0)

## 2011-12-24 MED ORDER — IOHEXOL 300 MG/ML  SOLN
100.0000 mL | Freq: Once | INTRAMUSCULAR | Status: AC | PRN
  Administered 2011-12-24: 100 mL via INTRAVENOUS

## 2011-12-24 MED ORDER — GI COCKTAIL ~~LOC~~
ORAL | Status: AC
  Administered 2011-12-24: 30 mL via ORAL
  Filled 2011-12-24: qty 30

## 2011-12-24 MED ORDER — FENTANYL CITRATE 0.05 MG/ML IJ SOLN
50.0000 ug | Freq: Once | INTRAMUSCULAR | Status: AC
  Administered 2011-12-24: 50 ug via INTRAVENOUS
  Filled 2011-12-24: qty 2

## 2011-12-24 MED ORDER — SUCRALFATE 1 GM/10ML PO SUSP: 1.0000 g | Freq: Four times a day (QID) | ORAL | Status: DC

## 2011-12-24 MED ORDER — SODIUM CHLORIDE 0.9 % IV BOLUS (SEPSIS)
1000.0000 mL | Freq: Once | INTRAVENOUS | Status: AC
  Administered 2011-12-24: 1000 mL via INTRAVENOUS

## 2011-12-24 MED ORDER — GI COCKTAIL ~~LOC~~
30.0000 mL | Freq: Once | ORAL | Status: AC
  Administered 2011-12-24: 30 mL via ORAL

## 2011-12-24 MED ORDER — ONDANSETRON HCL 4 MG/2ML IJ SOLN
4.0000 mg | Freq: Once | INTRAMUSCULAR | Status: AC
  Administered 2011-12-24: 4 mg via INTRAVENOUS
  Filled 2011-12-24: qty 2

## 2011-12-24 MED ORDER — TRAMADOL HCL 50 MG PO TABS: 50.0000 mg | ORAL_TABLET | Freq: Four times a day (QID) | ORAL | Status: AC | PRN

## 2011-12-24 MED ORDER — IOHEXOL 300 MG/ML  SOLN
40.0000 mL | Freq: Once | INTRAMUSCULAR | Status: AC | PRN
  Administered 2011-12-24: 40 mL via ORAL

## 2011-12-24 NOTE — ED Provider Notes (Signed)
History     CSN: [id number]  Arrival date & time 12/24/11  0130   First MD Initiated Contact with Patient 12/24/11 0136      Chief Complaint  Patient presents with  . Flank Pain    (Consider location/radiation/quality/duration/timing/severity/associated sxs/prior treatment) Patient is a 41 y.o. male presenting with flank pain and abdominal pain. The history is provided by the patient. No language interpreter was used.  Flank Pain This is a new problem. The current episode started more than 1 week ago (3 weeks). The problem occurs constantly. The problem has not changed since onset.Associated symptoms include abdominal pain. Pertinent negatives include no chest pain, no headaches and no shortness of breath. Nothing aggravates the symptoms. He has tried nothing for the symptoms. The treatment provided no relief.  Abdominal Pain The primary symptoms of the illness include abdominal pain and nausea. The primary symptoms of the illness do not include fever, shortness of breath, vomiting or diarrhea. The current episode started more than 2 days ago (3 weeks). The onset of the illness was gradual. The problem has not changed since onset. The abdominal pain began more than 2 days ago. The pain came on gradually. The abdominal pain has been unchanged since its onset. The abdominal pain does not radiate. The abdominal pain is relieved by nothing. Exacerbated by: nothing.  The illness is associated with recent antibiotic use. The patient has not had a change in bowel habit. Risk factors: none. Additional symptoms associated with the illness include heartburn. Symptoms associated with the illness do not include diaphoresis, frequency or back pain. Associated symptoms comments: Also constant x 3 weeks. Significant associated medical issues include diverticulitis.    Past Medical History  Diagnosis Date  . GERD (gastroesophageal reflux disease)   . Back pain     Past Surgical History  Procedure Date   . Joint replacement     History reviewed. No pertinent family history.  History  Substance Use Topics  . Smoking status: Never Smoker   . Smokeless tobacco: Not on file  . Alcohol Use: 3.0 oz/week    5 Cans of beer per week      Review of Systems  Constitutional: Negative for fever and diaphoresis.  HENT: Negative for neck pain and neck stiffness.   Respiratory: Negative for shortness of breath.   Cardiovascular: Negative for chest pain, palpitations and leg swelling.  Gastrointestinal: Positive for heartburn, nausea and abdominal pain. Negative for vomiting and diarrhea.  Genitourinary: Positive for flank pain. Negative for frequency.  Musculoskeletal: Negative for back pain.  Neurological: Positive for light-headedness. Negative for headaches.  All other systems reviewed and are negative.    Allergies  Review of patient's allergies indicates no known allergies.  Home Medications   Current Outpatient Rx  Name Route Sig Dispense Refill  . ALPRAZOLAM 0.25 MG PO TABS Oral Take 0.25 mg by mouth daily as needed. For anxiety     . CELEXA PO Oral Take 1 tablet by mouth at bedtime.      Marland Kitchen HYDROCODONE-ACETAMINOPHEN 5-325 MG PO TABS Oral Take 1 tablet by mouth every 6 (six) hours as needed. For pain    . OMEPRAZOLE 20 MG PO CPDR Oral Take 20 mg by mouth daily.        BP 130/94  Pulse 61  Temp 97.6 F (36.4 C) (Oral)  Resp 20  SpO2 98%  Physical Exam  Constitutional: He is oriented to person, place, and time. He appears well-developed and well-nourished. No distress.  HENT:  Head: Normocephalic and atraumatic.  Mouth/Throat: Oropharynx is clear and moist.  Eyes: Conjunctivae are normal. Pupils are equal, round, and reactive to light.  Neck: Normal range of motion. Neck supple.  Cardiovascular: Normal rate and regular rhythm.   Pulmonary/Chest: Effort normal and breath sounds normal. He has no wheezes. He has no rales.  Abdominal: Soft. Bowel sounds are normal. He  exhibits no distension. There is no tenderness. There is no rebound and no guarding.  Musculoskeletal: Normal range of motion.  Neurological: He is alert and oriented to person, place, and time.  Skin: Skin is warm and dry.  Psychiatric: He has a normal mood and affect.    ED Course  Procedures (including critical care time)   Labs Reviewed  CBC WITH DIFFERENTIAL  COMPREHENSIVE METABOLIC PANEL  TROPONIN I  URINALYSIS, ROUTINE W REFLEX MICROSCOPIC   No results found.   No diagnosis found.    MDM   Date: 12/24/2011  Rate: 65  Rhythm: normal sinus rhythm  QRS Axis: normal  Intervals: normal  ST/T Wave abnormalities: normal  Conduction Disutrbances: none  Narrative Interpretation: unremarkable   In the setting of > 8 hours of indigestion/GERD sx one negative EKG and troponin is sufficient to r/o ACS.  Symptoms are clearly consistent with his previously diagnosed diverticulitis and h/o gerd.  Will change gerd meds and have patient follow up with family doctor for ongoing care of abdominal pain         Lakitha Gordy K Kennedi Lizardo-Rasch, MD 12/24/11 [phone]

## 2011-12-24 NOTE — ED Notes (Signed)
Pt presents to ED today with flank pain, dizziness, and indigestion for the last 3 weeks.  Pt has been seen here for same and states sx have not decreased.

## 2011-12-25 ENCOUNTER — Telehealth: Payer: Self-pay | Admitting: Gastroenterology

## 2011-12-25 LAB — URINE CULTURE: Culture: NO GROWTH

## 2011-12-26 NOTE — Telephone Encounter (Signed)
Pt was seen in ER for left side abd pain, dx diverticulitis.  Dr Azucena Cecil wants pt seen first afternoon appt.  Talbert Forest will fax notes attn to [facility]

## 2012-01-01 ENCOUNTER — Ambulatory Visit: Payer: 59 | Admitting: Nurse Practitioner

## 2012-01-02 ENCOUNTER — Ambulatory Visit: Payer: 59 | Admitting: Gastroenterology

## 2012-01-22 ENCOUNTER — Ambulatory Visit (INDEPENDENT_AMBULATORY_CARE_PROVIDER_SITE_OTHER): Payer: 59 | Admitting: Gastroenterology

## 2012-01-22 ENCOUNTER — Encounter: Payer: Self-pay | Admitting: Gastroenterology

## 2012-01-22 VITALS — BP 96/70 | HR 72 | Ht 71.0 in | Wt 218.2 lb

## 2012-01-22 DIAGNOSIS — K5792 Diverticulitis of intestine, part unspecified, without perforation or abscess without bleeding: Secondary | ICD-10-CM

## 2012-01-22 DIAGNOSIS — R1110 Vomiting, unspecified: Secondary | ICD-10-CM

## 2012-01-22 DIAGNOSIS — K5732 Diverticulitis of large intestine without perforation or abscess without bleeding: Secondary | ICD-10-CM

## 2012-01-22 NOTE — Progress Notes (Signed)
Review of gastrointestinal problems:  1. Chronic constipation: colonoscopy January 2009 showing left side diverticulosis. No adenomas. Symptoms somewhat relieved with MiraLax daily.  2. Chronic abdominal discomfort and bloating, intermittent dysphagia. Status post EGD January 2009 showing a 3 cm hiatal hernia, reflux related esophagitis and a lower esophageal Schatzki's type ring dilated to 20 mm with a CRE balloon. Ultrasound January, 2011: normal gallbladder, no gallstones, ?hyperechoic liver. Labs in January, 2011: normal CBC, normal complete metabolic profile, normal thyroid testing, normal sedimentation rate.  3. routine risk for colon cancer, next colonoscopy January 2019   HPI: This is a   very pleasant 41 year old man whom I last saw several years ago.  He has vomiting, hours after a meal.  Regurgitating. Several times a day.  Several hours after a meal, regurgitates.  No nausea.   Was having left sided abdominal pain.  WBC was normal. Had a CT scan see below.  Completed cipro/flagyl. Was not febrile. The pains improved.    He thinks he had it in 2012.    He takes an otc stool softners.  Sometimes helps. Takes miralax periodically.  Takes 1-2 norco a day.  No bleeding.  Overall weight is a bit up lately after losing weight last year.  CT scan abdomen and pelvis with IV and oral contrast August 2013 showed mid descending colon inflammation with pericolonic stranding consistent with mild diverticulitis. He had a repeat CT scan about a week later showing improved inflammation.  CBC around that time was normal.  So was CMET.   Review of systems: Pertinent positive and negative review of systems were noted in the above HPI section. Complete review of systems was performed and was otherwise normal.    Past Medical History  Diagnosis Date  . GERD (gastroesophageal reflux disease)   . Back pain   . Diverticulitis     Past Surgical History  Procedure Date  . Joint replacement       Current Outpatient Prescriptions  Medication Sig Dispense Refill  . ALPRAZolam (XANAX) 0.25 MG tablet Take 0.25 mg by mouth daily as needed. For anxiety       . HYDROcodone-acetaminophen (NORCO) 5-325 MG per tablet Take 1 tablet by mouth every 6 (six) hours as needed. For pain      . omeprazole (PRILOSEC) 20 MG capsule Take 20 mg by mouth daily.        . sucralfate (CARAFATE) 1 GM/10ML suspension Take 10 mLs (1 g total) by mouth 4 (four) times daily.  420 mL  0    Allergies as of 01/22/2012 - Review Complete 01/22/2012  Allergen Reaction Noted  . Ciprofloxacin  01/22/2012    Family History  Problem Relation Age of Onset  . Diabetes Brother     x2  . Diabetes Maternal Grandfather     History   Social History  . Marital Status: Single    Spouse Name: N/A    Number of Children: N/A  . Years of Education: N/A   Occupational History  . 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 Smoking status: Never Smoker   . Smokeless tobacco: Never Used  . Alcohol Use: 3.0 oz/week    5 Cans of beer per week  . Drug Use: No  . Sexually Active: No   Other Topics Concern  . Not on file   Social History Narrative  . No narrative on file       Physical Exam: BP 96/70  Pulse 72  Ht 5\' 11"  (1.803 m)  Wt 218 lb 3.2 oz (98.975 kg)  BMI 30.43 kg/m2 Constitutional: generally well-appearing Psychiatric: alert and oriented x3 Eyes: extraocular movements intact Mouth: oral pharynx moist, no lesions Neck: supple no lymphadenopathy Cardiovascular: heart regular rate and rhythm Lungs: clear to auscultation bilaterally Abdomen: soft, nontender, nondistended, no obvious ascites, no peritoneal signs, normal bowel sounds Extremities: no lower extremity edema bilaterally Skin: no lesions on visible extremities    Assessment and plan: 41 y.o. male with  resolved left sided diverticulitis, recent regurgitation, abdominal pains  He has chronic  constipation and we are going to work on this to see if it can help prevent further episodes of diverticulitis. He will start taking MiraLax on a daily basis rather than just when necessary and will return to see me in 4-5 weeks to report on his symptoms. He has regurgitation but no dysphagia, no weight loss. He is on narcotic pain medicines to a day that can contribute to his constipation and can also slow the stomach. I am suspicious that he might have gastroparesis based on his symptoms. He'll get a gastric emptying scan to see if this is indeed true.

## 2012-01-22 NOTE — Patient Instructions (Addendum)
Miralax one scoop every single day for long term control of constipation. Narcotic pain meds can cause constipation and a slow stomach. Try to take as little as possible. Gastric emptying scan for vomiting.  Please arrive on 01/31/12 at 1245 pm for a 1 pm appointment at [facility].  Have nothing to eat or drink for 6 hours prior to the appointment. Return in 4-5 weeks to report on your symptoms.

## 2012-01-31 ENCOUNTER — Encounter (HOSPITAL_COMMUNITY)
Admission: RE | Admit: 2012-01-31 | Discharge: 2012-01-31 | Disposition: A | Discharge status: Home / Self Care | Payer: 59 | Source: Ambulatory Visit | Attending: Gastroenterology | Admitting: Gastroenterology

## 2012-01-31 DIAGNOSIS — R1110 Vomiting, unspecified: Secondary | ICD-10-CM

## 2012-01-31 DIAGNOSIS — R112 Nausea with vomiting, unspecified: Secondary | ICD-10-CM | POA: Insufficient documentation

## 2012-01-31 MED ORDER — TECHNETIUM TC 99M SULFUR COLLOID
2.2000 | Freq: Once | INTRAVENOUS | Status: AC | PRN
  Administered 2012-01-31: 2.2 via INTRAVENOUS

## 2012-03-01 ENCOUNTER — Ambulatory Visit: Payer: 59 | Admitting: Gastroenterology

## 2012-04-16 ENCOUNTER — Other Ambulatory Visit: Payer: Self-pay | Admitting: Family Medicine

## 2012-04-16 DIAGNOSIS — M5416 Radiculopathy, lumbar region: Secondary | ICD-10-CM

## 2012-04-18 ENCOUNTER — Ambulatory Visit
Admission: RE | Admit: 2012-04-18 | Discharge: 2012-04-18 | Disposition: A | Discharge status: Home / Self Care | Payer: BC Managed Care – PPO | Source: Ambulatory Visit | Attending: Family Medicine | Admitting: Family Medicine

## 2012-04-18 DIAGNOSIS — M5416 Radiculopathy, lumbar region: Secondary | ICD-10-CM

## 2012-05-08 HISTORY — PX: BACK SURGERY: SHX140

## 2012-09-17 ENCOUNTER — Encounter: Payer: Self-pay | Admitting: Gastroenterology

## 2012-09-17 ENCOUNTER — Ambulatory Visit (INDEPENDENT_AMBULATORY_CARE_PROVIDER_SITE_OTHER): Payer: BC Managed Care – PPO | Admitting: Gastroenterology

## 2012-09-17 VITALS — BP 120/84 | HR 72 | Ht 70.0 in | Wt 231.0 lb

## 2012-09-17 DIAGNOSIS — R1110 Vomiting, unspecified: Secondary | ICD-10-CM

## 2012-09-17 MED ORDER — METOCLOPRAMIDE HCL 5 MG PO TABS: 5.0000 mg | ORAL_TABLET | Freq: Two times a day (BID) | ORAL | Status: DC

## 2012-09-17 NOTE — Progress Notes (Signed)
Review of gastrointestinal problems:  1. Chronic constipation: colonoscopy January 2009 showing left side diverticulosis. No adenomas. Symptoms somewhat relieved with MiraLax daily.  2. Chronic abdominal discomfort and bloating, intermittent dysphagia. Status post EGD January 2009 showing a 3 cm hiatal hernia, reflux related esophagitis and a lower esophageal Schatzki's type ring dilated to 20 mm with a CRE balloon. Ultrasound January, 2011: normal gallbladder, no gallstones, ?hyperechoic liver. Labs in January, 2011: normal CBC, normal complete metabolic profile, normal thyroid testing, normal sedimentation rate.  GES 2013 was essentially normal. 3. routine risk for colon cancer, next colonoscopy January 2019 4. Mild acute diverticulitis: CT scan abdomen and pelvis with IV and oral contrast August 2013 showed mid descending colon inflammation with pericolonic stranding consistent with mild diverticulitis. He had a repeat CT scan about a week later showing improved inflammation. CBC around that time was normal. So was CMET.  Treated with oral ABX well.     HPI: This is a  very pleasant 42 year old man whom I last saw about a year ago.  Has been having left sided abdominal pains.  Takes miralax about every day.  Still can be a bit constipated.  Can regurgitate food from several hours later.   This occurs throughout the day.  Can have nausea thorughout the day.  Takes 1 at bedtime.    Eats 3 meals per day, no snacking.  Overall weight is up a bit lately.   Past Medical History  Diagnosis Date  . GERD (gastroesophageal reflux disease)   . Back pain   . Diverticulitis     Past Surgical History  Procedure Laterality Date  . Joint replacement    . Back surgery  05/2012  . Acl replacement  2004     right knee    Current Outpatient Prescriptions  Medication Sig Dispense Refill  . ALPRAZolam (XANAX) 0.25 MG tablet Take 0.25 mg by mouth daily as needed. For anxiety       .  HYDROcodone-acetaminophen (NORCO) 5-325 MG per tablet Take 1 tablet by mouth every 6 (six) hours as needed. For pain      . omeprazole (PRILOSEC) 20 MG capsule Take 20 mg by mouth daily.        . sucralfate (CARAFATE) 1 GM/10ML suspension Take 10 mLs (1 g total) by mouth 4 (four) times daily.  420 mL  0   No current facility-administered medications for this visit.    Allergies as of 09/17/2012 - Review Complete 09/17/2012  Allergen Reaction Noted  . Ciprofloxacin  01/22/2012    Family History  Problem Relation Age of Onset  . Diabetes Brother     x2  . Diabetes Maternal Grandfather   . Stomach cancer Maternal Grandfather     History   Social History  . Marital Status: Single    Spouse Name: N/A    Number of Children: N/A  . Years of Education: N/A   Occupational History  . 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 Smoking status: Never Smoker   . Smokeless tobacco: Never Used  . Alcohol Use: 3.0 oz/week    5 Cans of beer per week  . Drug Use: No  . Sexually Active: No   Other Topics Concern  . Not on file   Social History Narrative  . No narrative on file      Physical Exam: There were no vitals taken for this visit. Constitutional: generally well-appearing Psychiatric: alert and oriented x3 Abdomen:  soft, nontender, nondistended, no obvious ascites, no peritoneal signs, normal bowel sounds     Assessment and plan: 42 y.o. 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 When he try him on low dose twice daily Reglan and he will return to see me in 2 months, sooner if needed. I did explain to him about  tardive dyskinesia;  this is a very uncommon but irreversible side effect of Reglan. He will be started at much lower doses than is known  to cause this. He is also quite a bit younger than patients who had had in the past.

## 2012-09-17 NOTE — Patient Instructions (Addendum)
Trial of reglan.  Take one pill at bedtime every night and one pill at lunch every day.  5mg  pills. Please return to see Dr. Christella Hartigan in 2 months to report on your symptoms.                                               We are excited to introduce MyChart, a new best-in-class service that provides you online access to important information in your electronic medical record. We want to make it easier for you to view your health information - all in one secure location - when and where you need it. We expect MyChart will enhance the quality of care and service we provide.  When you register for MyChart, you can:    View your test results.    Request appointments and receive appointment reminders via email.    Request medication renewals.    View your medical history, allergies, medications and immunizations.    Communicate with your physician's office through a password-protected site.    Conveniently print information such as your medication lists.  To find out if MyChart is right for you, please talk to a member of our clinical staff today. We will gladly answer your questions about this free health and wellness tool.  If you are age 26 or older and want a member of your family to have access to your record, you must provide written consent by completing a proxy form available at our office. Please speak to our clinical staff about guidelines regarding accounts for patients younger than age 41.  As you activate your MyChart account and need any technical assistance, please call the MyChart technical support line at [phone]) or email your question to [email or web address]. If you email your question(s), please include your name, a return phone number and the best time to reach you.  If you have non-urgent health-related questions, you can send a message to our office through MyChart at [email or web address]. If you have a medical emergency, call 911.  Thank you for  using MyChart as your new health and wellness resource!   MyChart licensed from [employer],  [id number]. Patents Pending.

## 2012-11-19 ENCOUNTER — Encounter: Payer: Self-pay | Admitting: Gastroenterology

## 2012-11-19 ENCOUNTER — Emergency Department (HOSPITAL_BASED_OUTPATIENT_CLINIC_OR_DEPARTMENT_OTHER)
Admission: EM | Admit: 2012-11-19 | Discharge: 2012-11-19 | Disposition: A | Discharge status: Home / Self Care | Payer: BC Managed Care – PPO | Attending: Emergency Medicine | Admitting: Emergency Medicine

## 2012-11-19 ENCOUNTER — Ambulatory Visit (INDEPENDENT_AMBULATORY_CARE_PROVIDER_SITE_OTHER): Payer: BC Managed Care – PPO | Admitting: Gastroenterology

## 2012-11-19 ENCOUNTER — Emergency Department (HOSPITAL_BASED_OUTPATIENT_CLINIC_OR_DEPARTMENT_OTHER): Payer: BC Managed Care – PPO

## 2012-11-19 ENCOUNTER — Encounter (HOSPITAL_BASED_OUTPATIENT_CLINIC_OR_DEPARTMENT_OTHER): Payer: Self-pay | Admitting: *Deleted

## 2012-11-19 VITALS — BP 110/68 | HR 72 | Ht 70.0 in | Wt 228.2 lb

## 2012-11-19 DIAGNOSIS — R3989 Other symptoms and signs involving the genitourinary system: Secondary | ICD-10-CM | POA: Insufficient documentation

## 2012-11-19 DIAGNOSIS — R109 Unspecified abdominal pain: Secondary | ICD-10-CM

## 2012-11-19 DIAGNOSIS — Z8719 Personal history of other diseases of the digestive system: Secondary | ICD-10-CM | POA: Insufficient documentation

## 2012-11-19 DIAGNOSIS — F329 Major depressive disorder, single episode, unspecified: Secondary | ICD-10-CM | POA: Insufficient documentation

## 2012-11-19 DIAGNOSIS — Z79899 Other long term (current) drug therapy: Secondary | ICD-10-CM | POA: Insufficient documentation

## 2012-11-19 DIAGNOSIS — K219 Gastro-esophageal reflux disease without esophagitis: Secondary | ICD-10-CM | POA: Insufficient documentation

## 2012-11-19 DIAGNOSIS — R1012 Left upper quadrant pain: Secondary | ICD-10-CM

## 2012-11-19 DIAGNOSIS — R110 Nausea: Secondary | ICD-10-CM

## 2012-11-19 DIAGNOSIS — F3289 Other specified depressive episodes: Secondary | ICD-10-CM | POA: Insufficient documentation

## 2012-11-19 LAB — URINALYSIS, ROUTINE W REFLEX MICROSCOPIC
Bilirubin Urine: NEGATIVE
Hgb urine dipstick: NEGATIVE
Ketones, ur: NEGATIVE mg/dL
Specific Gravity, Urine: 1.005 (ref 1.005–1.030)
Urobilinogen, UA: 0.2 mg/dL (ref 0.0–1.0)
pH: 5 (ref 5.0–8.0)

## 2012-11-19 LAB — BASIC METABOLIC PANEL
BUN: 14 mg/dL (ref 6–23)
Calcium: 9.9 mg/dL (ref 8.4–10.5)
Creatinine, Ser: 1.1 mg/dL (ref 0.50–1.35)
GFR calc non Af Amer: 81 mL/min — ABNORMAL LOW (ref >90)
Glucose, Bld: 114 mg/dL — ABNORMAL HIGH (ref 70–99)

## 2012-11-19 LAB — CBC WITH DIFFERENTIAL/PLATELET
Eosinophils Absolute: 0.1 10*3/uL (ref 0.0–0.7)
Eosinophils Relative: 2 % (ref 0–5)
Hemoglobin: 13.9 g/dL (ref 13.0–17.0)
Lymphs Abs: 2.3 10*3/uL (ref 0.7–4.0)
MCH: 31.4 pg (ref 26.0–34.0)
MCV: 88.9 fL (ref 78.0–100.0)
Monocytes Absolute: 0.9 10*3/uL (ref 0.1–1.0)
Monocytes Relative: 10 % (ref 3–12)
RBC: 4.42 MIL/uL (ref 4.22–5.81)

## 2012-11-19 MED ORDER — OXYCODONE-ACETAMINOPHEN 5-325 MG PO TABS: ORAL_TABLET | ORAL | Status: DC

## 2012-11-19 MED ORDER — OXYCODONE-ACETAMINOPHEN 5-325 MG PO TABS
2.0000 | ORAL_TABLET | Freq: Once | ORAL | Status: AC
  Administered 2012-11-19: 2 via ORAL
  Filled 2012-11-19 (×2): qty 2

## 2012-11-19 MED ORDER — ONDANSETRON HCL 4 MG PO TABS: 4.0000 mg | ORAL_TABLET | Freq: Two times a day (BID) | ORAL | Status: AC | PRN

## 2012-11-19 NOTE — ED Notes (Signed)
PA at bedside now  

## 2012-11-19 NOTE — ED Notes (Signed)
PA at bedside.

## 2012-11-19 NOTE — ED Notes (Addendum)
Pt c/o lower back pain x 1 week denies injury, " foul smelling urine"

## 2012-11-19 NOTE — Progress Notes (Signed)
Review of gastrointestinal problems:  1. Chronic constipation: colonoscopy January 2009 showing left side diverticulosis. No adenomas. Symptoms somewhat relieved with MiraLax daily.  2. Chronic abdominal discomfort and bloating, intermittent dysphagia. Status post EGD January 2009 showing a 3 cm hiatal hernia, reflux related esophagitis and a lower esophageal Schatzki's type ring dilated to 20 mm with a CRE balloon. Ultrasound January, 2011: normal gallbladder, no gallstones, ?hyperechoic liver. Labs in January, 2011: normal CBC, normal complete metabolic profile, normal thyroid testing, normal sedimentation rate. GES 2013 was essentially normal.  Summer 2014 continued abd discomforts, felt clinically to be related to mild gastroparesis from narcotics, trial of low dose reglan twice daily started. 3. routine risk for colon cancer, next colonoscopy January 2019  4. Mild acute diverticulitis: CT scan abdomen and pelvis with IV and oral contrast August 2013 showed mid descending colon inflammation with pericolonic stranding consistent with mild diverticulitis. He had a repeat CT scan about a week later showing improved inflammation. CBC around that time was normal. So was CMET. Treated with oral ABX well.   HPI: This is a   pleasant 42 year old man whom I saw about 2-3 months ago.  Trial of reglan 54m bid.  He only noticed a slight improvement in his symptoms.  He still has intermittent regurgitation of food. Nausea.  He has tried zofran in the past with some help. He has some left sided abd pains.  Overall his weight has been stable.   Past Medical History  Diagnosis Date  . GERD (gastroesophageal reflux disease)   . Back pain   . Diverticulitis   . Depression     Past Surgical History  Procedure Laterality Date  . Joint replacement    . Back surgery  05/2012  . Acl replacement  2004     right knee    Current Outpatient Prescriptions  Medication Sig Dispense Refill  . ALPRAZolam (XANAX)  0.25 MG tablet Take 0.25 mg by mouth daily as needed. For anxiety       . buPROPion (WELLBUTRIN XL) 300 MG 24 hr tablet Take 300 mg by mouth daily.      Marland Kitchen HYDROcodone-acetaminophen (NORCO) 5-325 MG per tablet Take 1 tablet by mouth every 6 (six) hours as needed. For pain      . metoCLOPramide (REGLAN) 5 MG tablet Take 1 tablet (5 mg total) by mouth 2 (two) times daily. Take one a bedtime and one at lunchtime  60 tablet  2  . omeprazole (PRILOSEC) 20 MG capsule Take 20 mg by mouth daily.        . sucralfate (CARAFATE) 1 GM/10ML suspension Take 10 mLs (1 g total) by mouth 4 (four) times daily.  420 mL  0   No current facility-administered medications for this visit.    Allergies as of 11/19/2012 - Review Complete 11/19/2012  Allergen Reaction Noted  . Ciprofloxacin  01/22/2012    Family History  Problem Relation Age of Onset  . Diabetes Brother     x2  . Diabetes Maternal Grandfather   . Stomach cancer Maternal Grandfather     History   Social History  . Marital Status: Single    Spouse Name: N/A    Number of Children: N/A  . Years of Education: N/A   Occupational History  . 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 Smoking status: Never Smoker   . Smokeless tobacco: Never Used  . Alcohol Use: 3.0 oz/week    5  Cans of beer per week  . Drug Use: No  . Sexually Active: No   Other Topics Concern  . Not on file   Social History Narrative  . No narrative on file      Physical Exam: BP 110/68  Pulse 72  Ht 5\' 10"  (1.778 m)  Wt 228 lb 3.2 oz (103.511 kg)  BMI 32.74 kg/m2 Constitutional: generally well-appearing Psychiatric: alert and oriented x3 Abdomen: soft, nontender, nondistended, no obvious ascites, no peritoneal signs, normal bowel sounds     Assessment and plan: 42 y.o. male with chronic GI complaints, recent nausea and left upper quadrant discomforts  Trial of Reglan did not help inside of removed that from his medicine  list. He had EGD about 5 years ago given his persistent upper GI-like symptoms I would like to repeat that now. It should be done with MAC given his chronic narcotic use. I have also called in a prescription for Zofran since it seems to help with his nausea.

## 2012-11-19 NOTE — ED Notes (Signed)
Patient transported to CT via stretcher.

## 2012-11-19 NOTE — ED Notes (Signed)
Pt was seen by GI this am.

## 2012-11-19 NOTE — Patient Instructions (Addendum)
You will be set up for an upper endoscopy with MAC for left sided abd pains, nausea. Stop the reglan. this was removed from your med list already. New prescription for zofran was called in.                                               We are excited to introduce MyChart, a new best-in-class service that provides you online access to important information in your electronic medical record. We want to make it easier for you to view your health information - all in one secure location - when and where you need it. We expect MyChart will enhance the quality of care and service we provide.  When you register for MyChart, you can:    View your test results.    Request appointments and receive appointment reminders via email.    Request medication renewals.    View your medical history, allergies, medications and immunizations.    Communicate with your physician's office through a password-protected site.    Conveniently print information such as your medication lists.  To find out if MyChart is right for you, please talk to a member of our clinical staff today. We will gladly answer your questions about this free health and wellness tool.  If you are age 43 or older and want a member of your family to have access to your record, you must provide written consent by completing a proxy form available at our office. Please speak to our clinical staff about guidelines regarding accounts for patients younger than age 54.  As you activate your MyChart account and need any technical assistance, please call the MyChart technical support line at [phone]) or email your question to [email or web address]. If you email your question(s), please include your name, a return phone number and the best time to reach you.  If you have non-urgent health-related questions, you can send a message to our office through MyChart at [email or web address]. If you have a medical emergency, call  911.  Thank you for using MyChart as your new health and wellness resource!   MyChart licensed from [employer],  [id number]. Patents Pending.

## 2012-11-19 NOTE — ED Provider Notes (Signed)
History    CSN: [id number] Arrival date & time 11/19/12  1928  First MD Initiated Contact with Patient 11/19/12 2011     Chief Complaint  Patient presents with  . Back Pain   (Consider location/radiation/quality/duration/timing/severity/associated sxs/prior Treatment) HPI Comments: 42 y.o. Male with PMHx of diverticulosis, lower back pain, and GERD presents today complaining of left flank pain. Onset a week. Gradually worsening. Worse today. Radiating at times around to left groin area. Intermittent. Rates pain at 5/10. Described as sharp. Admits foul smelling urine. Denies fever, nausea, vomiting, dysuria, hematuria, bowel/bladder incontinence.   Patient is a 42 y.o. male presenting with flank pain.  Flank Pain Pertinent negatives include no diaphoresis, nausea, neck pain, rash or vomiting.   Past Medical History  Diagnosis Date  . GERD (gastroesophageal reflux disease)   . Back pain   . Diverticulitis   . Depression    Past Surgical History  Procedure Laterality Date  . Joint replacement    . Back surgery  05/2012  . Acl replacement  2004     right knee   Family History  Problem Relation Age of Onset  . Diabetes Brother     x2  . Diabetes Maternal Grandfather   . Stomach cancer Maternal Grandfather    History  Substance Use Topics  . Smoking status: Never Smoker   . Smokeless tobacco: Never Used  . Alcohol Use: 3.0 oz/week    5 Cans of beer per week    Review of Systems  Constitutional: Negative for diaphoresis.  HENT: Negative for neck pain and neck stiffness.   Eyes: Negative for visual disturbance.  Respiratory: Negative for apnea, chest tightness and shortness of breath.   Cardiovascular: Negative for palpitations.  Gastrointestinal: Negative for nausea, vomiting, diarrhea and constipation.  Genitourinary: Positive for flank pain.       Left flank pain  Musculoskeletal: Negative for gait problem.  Skin: Negative for rash.  Neurological: Negative for  dizziness and light-headedness.    Allergies  Ciprofloxacin  Home Medications   Current Outpatient Rx  Name  Route  Sig  Dispense  Refill  . ALPRAZolam (XANAX) 0.25 MG tablet   Oral   Take 0.25 mg by mouth daily as needed. For anxiety          . buPROPion (WELLBUTRIN XL) 300 MG 24 hr tablet   Oral   Take 300 mg by mouth daily.         Marland Kitchen HYDROcodone-acetaminophen (NORCO) 5-325 MG per tablet   Oral   Take 1 tablet by mouth every 6 (six) hours as needed. For pain         . omeprazole (PRILOSEC) 20 MG capsule   Oral   Take 20 mg by mouth daily.           . ondansetron (ZOFRAN) 4 MG tablet   Oral   Take 1 tablet (4 mg total) by mouth every 12 (twelve) hours as needed for nausea.   30 tablet   4   . oxyCODONE-acetaminophen (PERCOCET/ROXICET) 5-325 MG per tablet      Take one or two tablets by mouth every 4 to 6 hours as needed for pain.   10 tablet   0   . EXPIRED: sucralfate (CARAFATE) 1 GM/10ML suspension   Oral   Take 10 mLs (1 g total) by mouth 4 (four) times daily.   420 mL   0    BP 129/86  Pulse 71  Temp(Src) 98.8 F (37.1  C) (Oral)  Resp 16  Ht 5\' 9"  (1.753 m)  Wt 220 lb (99.791 kg)  BMI 32.47 kg/m2  SpO2 100% Physical Exam  Nursing note and vitals reviewed. Constitutional: He is oriented to person, place, and time. He appears well-developed and well-nourished. No distress.  HENT:  Head: Normocephalic and atraumatic.  Eyes: Conjunctivae and EOM are normal.  Neck: Normal range of motion. Neck supple.  No meningeal signs  Cardiovascular: Normal rate, regular rhythm, normal heart sounds and intact distal pulses.  Exam reveals no gallop and no friction rub.   No murmur heard. Pulmonary/Chest: Effort normal and breath sounds normal. No respiratory distress. He has no wheezes. He has no rales. He exhibits no tenderness.  Abdominal: Soft. Bowel sounds are normal. He exhibits no distension. There is no tenderness. There is CVA tenderness. There is no  rebound and no guarding.  Left CVA tenderness  Musculoskeletal: Normal range of motion. He exhibits no edema and no tenderness.  FROM to upper and lower extremities No step-offs noted on C-spine No tenderness to palpation of the spinous processes of the C-spine, T-spine or L-spine Full range of motion of C-spine, T-spine, L-spine No tenderness to palpation of the paraspinous muscles   Neurological: He is alert and oriented to person, place, and time. No cranial nerve deficit.  Speech is clear and goal oriented, follows commands Sensation normal to light touch and two point discrimination Moves extremities without ataxia, coordination intact Normal gait and balance Normal strength in upper and lower extremities bilaterally including dorsiflexion and plantar flexion, strong and equal grip strength   Skin: Skin is warm and dry. He is not diaphoretic. No erythema.  Psychiatric: He has a normal mood and affect.    ED Course  Procedures (including critical care time) Labs Reviewed  BASIC METABOLIC PANEL - Abnormal; Notable for the following:    Glucose, Bld 114 (*)    GFR calc non Af Amer 81 (*)    All other components within normal limits  URINALYSIS, ROUTINE W REFLEX MICROSCOPIC  CBC WITH DIFFERENTIAL   Ct Abdomen Pelvis Wo Contrast  11/19/2012   *RADIOLOGY REPORT*  Clinical Data: Left flank pain.  Back pain.  CT ABDOMEN AND PELVIS WITHOUT CONTRAST  Technique:  Multidetector CT imaging of the abdomen and pelvis was performed following the standard protocol without intravenous contrast.  Comparison: 12/24/2011  Findings: The visualized portion of the liver, spleen, pancreas, and adrenal glands appear unremarkable in noncontrast CT appearance.  Contracted gallbladder noted without visible gallstones.  No renal calculi or hydronephrosis observed.  No ureteral or bladder calculus identified.  Appendix normal.  No dilated bowel.  No pathologic adenopathy observed.  Left inguinal hernia contains  adipose tissue. Transitional L5 with broad transverse processes articulating with the sacrum. Postoperative findings at L4-5.  IMPRESSION:  1.  No specific abnormality to account right with the patient's left flank pain.  There is descending and sigmoid colon diverticulosis without active diverticulitis. 2.  Stable small left inguinal hernia contains adipose tissue. 3.  Postoperative findings in the lumbar spine at L4-5.   Original Report Authenticated By: Gaylyn Rong, M.D.   1. Left flank pain     MDM  Left flank pain. Poss kidney stone. Will get imaging to rule out.   Imaging is negative for stone. Identifies existing diverticulosis with no evidence of diverticulitis, existing lower back findings, and stable small left inguinal hernia containing adipose tissue. Discussed outpt follow up for each of these findings with GI,  ortho, and gen surg and provided contact info.  At this time there does not appear to be any evidence of an acute emergency medical condition and the patient appears stable for discharge with appropriate outpatient follow up.Diagnosis was discussed with patient who verbalizes understanding and is agreeable to discharge.   Glade Nurse, PA-C 11/20/12 0002

## 2012-11-20 NOTE — ED Provider Notes (Signed)
Medical screening examination/treatment/procedure(s) were performed by non-physician practitioner and as supervising physician I was immediately available for consultation/collaboration.   Joya Willmott, MD 11/20/12 0003 

## 2012-12-04 ENCOUNTER — Telehealth: Payer: Self-pay | Admitting: Gastroenterology

## 2012-12-05 NOTE — Telephone Encounter (Signed)
The patient has been notified of this information and all questions answered.

## 2012-12-05 NOTE — Telephone Encounter (Signed)
He was in ER 2-3 weeks ago.  CT scan from then shows no clear GI etiology of his pains.  Should continue with the suggestions outlined at recent visit.

## 2013-01-10 ENCOUNTER — Encounter: Payer: Self-pay | Admitting: Gastroenterology

## 2013-01-10 ENCOUNTER — Ambulatory Visit (AMBULATORY_SURGERY_CENTER): Payer: BC Managed Care – PPO | Admitting: Gastroenterology

## 2013-01-10 VITALS — BP 113/66 | HR 45 | Temp 97.1°F | Resp 35 | Ht 70.0 in | Wt 228.0 lb

## 2013-01-10 DIAGNOSIS — K2970 Gastritis, unspecified, without bleeding: Secondary | ICD-10-CM

## 2013-01-10 DIAGNOSIS — K2990 Gastroduodenitis, unspecified, without bleeding: Secondary | ICD-10-CM

## 2013-01-10 DIAGNOSIS — R1012 Left upper quadrant pain: Secondary | ICD-10-CM

## 2013-01-10 MED ORDER — ONDANSETRON HCL 2 MG/ML IJ SOLN: 4.0000 mg | Freq: Once | INTRAMUSCULAR | Status: DC

## 2013-01-10 MED ORDER — SODIUM CHLORIDE 0.9 % IV SOLN: 500.0000 mL | INTRAVENOUS | Status: DC

## 2013-01-10 NOTE — Progress Notes (Signed)
Patient did not have preoperative order for IV antibiotic SSI prophylaxis. (G8918)  Patient did not experience any of the following events: a burn prior to discharge; a fall within the facility; wrong site/side/patient/procedure/implant event; or a hospital transfer or hospital admission upon discharge from the facility. (G8907)  

## 2013-01-10 NOTE — Patient Instructions (Addendum)
YOU HAD AN ENDOSCOPIC PROCEDURE TODAY AT THE [facility]: Refer to the procedure report that was given to you for any specific questions about what was found during the examination.  If the procedure report does not answer your questions, please call your gastroenterologist to clarify.  If you requested that your care partner not be given the details of your procedure findings, then the procedure report has been included in a sealed envelope for you to review at your convenience later.  YOU SHOULD EXPECT: Some feelings of bloating in the abdomen. Passage of more gas than usual.  Walking can help get rid of the air that was put into your GI tract during the procedure and reduce the bloating. If you had a lower endoscopy (such as a colonoscopy or flexible sigmoidoscopy) you may notice spotting of blood in your stool or on the toilet paper. If you underwent a bowel prep for your procedure, then you may not have a normal bowel movement for a few days.  DIET: Your first meal following the procedure should be a light meal and then it is ok to progress to your normal diet.  A half-sandwich or bowl of soup is an example of a good first meal.  Heavy or fried foods are harder to digest and may make you feel nauseous or bloated.  Likewise meals heavy in dairy and vegetables can cause extra gas to form and this can also increase the bloating.  Drink plenty of fluids but you should avoid alcoholic beverages for 24 hours.  ACTIVITY: Your care partner should take you home directly after the procedure.  You should plan to take it easy, moving slowly for the rest of the day.  You can resume normal activity the day after the procedure however you should NOT DRIVE or use heavy machinery for 24 hours (because of the sedation medicines used during the test).    SYMPTOMS TO REPORT IMMEDIATELY: A gastroenterologist can be reached at any hour.  During normal business hours, 8:30 AM to 5:00 PM Monday through Friday,  call [phone].  After hours and on weekends, please call the GI answering service at [phone] who will take a message and have the physician on call contact you.   Following upper endoscopy (EGD)  Vomiting of blood or coffee ground material  New chest pain or pain under the shoulder blades  Painful or persistently difficult swallowing  New shortness of breath  Fever of 100F or higher  Black, tarry-looking stools  FOLLOW UP: If any biopsies were taken you will be contacted by phone or by letter within the next 1-3 weeks.  Call your gastroenterologist if you have not heard about the biopsies in 3 weeks.  Our staff will call the home number listed on your records the next business day following your procedure to check on you and address any questions or concerns that you may have at that time regarding the information given to you following your procedure. This is a courtesy call and so if there is no answer at the home number and we have not heard from you through the emergency physician on call, we will assume that you have returned to your regular daily activities without incident.  SIGNATURES/CONFIDENTIALITY: You and/or your care partner have signed paperwork which will be entered into your electronic medical record.  These signatures attest to the fact that that the information above on your After Visit Summary has been reviewed and is understood.  Full responsibility   of the confidentiality of this discharge information lies with you and/or your care-partner.  Recommendations See procedure report 

## 2013-01-10 NOTE — Progress Notes (Signed)
Called to room to assist during endoscopic procedure.  Patient ID and intended procedure confirmed with present staff. Received instructions for my participation in the procedure from the performing physician.  

## 2013-01-10 NOTE — Progress Notes (Signed)
Procedure ends, to recovery, report given and VSS. 

## 2013-01-10 NOTE — Op Note (Signed)
[facility] [address] [place], [address]   ENDOSCOPY PROCEDURE REPORT  PATIENT: Daniel Morrison, Daniel Morrison  MR#: [id number] BIRTHDATE: [birth year] , 42  yrs. old GENDER: Male ENDOSCOPIST: Rachael Fee, MD PROCEDURE DATE:  01/10/2013 PROCEDURE:  EGD w/ biopsy ASA CLASS:     Class III INDICATIONS:  Chronic left sided abd pain, persistent. MEDICATIONS: propofol (Diprivan) 150mg  IV and MAC sedation, administered by CRNA TOPICAL ANESTHETIC: none  DESCRIPTION OF PROCEDURE: After the risks benefits and alternatives of the procedure were thoroughly explained, informed consent was obtained.  The LB [id number] endoscope was introduced through the mouth and advanced to the second portion of the duodenum. Without limitations.  The instrument was slowly withdrawn as the mucosa was fully examined.     There was mild, non-specific distal gastritis.  Biopsies taken and sent to pathology.  The examination was otherwise normal. Retroflexed views revealed no abnormalities.     The scope was then withdrawn from the patient and the procedure completed. COMPLICATIONS: There were no complications.  ENDOSCOPIC IMPRESSION: There was mild, non-specific distal gastritis.  Biopsies taken and sent to pathology. The examination was otherwise normal.  RECOMMENDATIONS: If biopsies show H.  pylori, you will be started on appropriate antibiotics.  No further GI workup is needed given CT scans, EGD, labs, chronicity of symptoms.   eSigned:  Rachael Fee, MD 01/10/2013 10:18 AM

## 2013-01-13 ENCOUNTER — Telehealth: Payer: Self-pay | Admitting: *Deleted

## 2013-01-13 NOTE — Telephone Encounter (Signed)
  Follow up Call-  Call back number 01/10/2013  Post procedure Call Back phone  # [phone]  Permission to leave phone message Yes     Patient questions:  Do you have a fever, pain , or abdominal swelling? no Pain Score  0 *  Have you tolerated food without any problems? yes  Have you been able to return to your normal activities? yes  Do you have any questions about your discharge instructions: Diet   no Medications  no Follow up visit  no  Do you have questions or concerns about your Care? no  Actions: * If pain score is 4 or above: No action needed, pain <4.

## 2013-01-21 ENCOUNTER — Encounter: Payer: Self-pay | Admitting: Gastroenterology

## 2013-03-02 ENCOUNTER — Emergency Department (HOSPITAL_BASED_OUTPATIENT_CLINIC_OR_DEPARTMENT_OTHER)
Admission: EM | Admit: 2013-03-02 | Discharge: 2013-03-02 | Disposition: A | Discharge status: Home / Self Care | Payer: BC Managed Care – PPO | Attending: Emergency Medicine | Admitting: Emergency Medicine

## 2013-03-02 ENCOUNTER — Encounter (HOSPITAL_BASED_OUTPATIENT_CLINIC_OR_DEPARTMENT_OTHER): Payer: Self-pay | Admitting: Emergency Medicine

## 2013-03-02 DIAGNOSIS — F329 Major depressive disorder, single episode, unspecified: Secondary | ICD-10-CM | POA: Insufficient documentation

## 2013-03-02 DIAGNOSIS — F3289 Other specified depressive episodes: Secondary | ICD-10-CM | POA: Insufficient documentation

## 2013-03-02 DIAGNOSIS — F411 Generalized anxiety disorder: Secondary | ICD-10-CM | POA: Insufficient documentation

## 2013-03-02 DIAGNOSIS — Z79899 Other long term (current) drug therapy: Secondary | ICD-10-CM | POA: Insufficient documentation

## 2013-03-02 DIAGNOSIS — Z8639 Personal history of other endocrine, nutritional and metabolic disease: Secondary | ICD-10-CM | POA: Insufficient documentation

## 2013-03-02 DIAGNOSIS — Z8739 Personal history of other diseases of the musculoskeletal system and connective tissue: Secondary | ICD-10-CM | POA: Insufficient documentation

## 2013-03-02 DIAGNOSIS — J40 Bronchitis, not specified as acute or chronic: Secondary | ICD-10-CM | POA: Insufficient documentation

## 2013-03-02 DIAGNOSIS — K219 Gastro-esophageal reflux disease without esophagitis: Secondary | ICD-10-CM | POA: Insufficient documentation

## 2013-03-02 DIAGNOSIS — Z862 Personal history of diseases of the blood and blood-forming organs and certain disorders involving the immune mechanism: Secondary | ICD-10-CM | POA: Insufficient documentation

## 2013-03-02 MED ORDER — PREDNISONE 50 MG PO TABS
60.0000 mg | ORAL_TABLET | Freq: Once | ORAL | Status: AC
  Administered 2013-03-02: 60 mg via ORAL
  Filled 2013-03-02 (×2): qty 1

## 2013-03-02 MED ORDER — PREDNISONE 20 MG PO TABS: 60.0000 mg | ORAL_TABLET | Freq: Every day | ORAL | Status: DC

## 2013-03-02 MED ORDER — ALBUTEROL SULFATE HFA 108 (90 BASE) MCG/ACT IN AERS
INHALATION_SPRAY | RESPIRATORY_TRACT | Status: AC
  Filled 2013-03-02: qty 6.7

## 2013-03-02 MED ORDER — HYDROCOD POLST-CHLORPHEN POLST 10-8 MG/5ML PO LQCR: 5.0000 mL | Freq: Two times a day (BID) | ORAL | Status: DC | PRN

## 2013-03-02 MED ORDER — ALBUTEROL SULFATE HFA 108 (90 BASE) MCG/ACT IN AERS
2.0000 | INHALATION_SPRAY | RESPIRATORY_TRACT | Status: DC | PRN
  Administered 2013-03-02: 2 via RESPIRATORY_TRACT

## 2013-03-02 NOTE — ED Notes (Signed)
Patient c/o wheezing & SOB, went to primary MD on Wednesday, dry cough...given inhaler, body aches

## 2013-03-02 NOTE — ED Provider Notes (Signed)
CSN: [id number]     Arrival date & time 03/02/13  1810 History  This chart was scribed for Charles B. Bernette Mayers, MD by Leone Payor, ED Scribe. This patient was seen in room MH04/MH04 and the patient's care was started 6:25 PM.    Chief Complaint  Patient presents with  . Shortness of Breath    The history is provided by the patient. No language interpreter was used.    HPI Comments: Daniel Morrison is a 42 y.o. male who presents to the Emergency Department complaining of constant, unchanged wheezing and SOB that began 2 days ago. Pt states he was seen by PCP 4 days ago for a dry cough. Pt has taken mucinex and night time OTC medication without relief. He states he has a cough which is dry except for in the mornings. He has an old inhaler which he used without relief. He denies any recent foreign travel.   Past Medical History  Diagnosis Date  . GERD (gastroesophageal reflux disease)   . Back pain   . Diverticulitis   . Depression   . Anxiety   . Hyperlipidemia   . Seizures     AS CHILD AGE 33   Past Surgical History  Procedure Laterality Date  . Joint replacement    . Back surgery  05/2012  . Acl replacement  2004     right knee  . Upper gastrointestinal endoscopy    . Colonoscopy     Family History  Problem Relation Age of Onset  . Diabetes Brother     x2  . Diabetes Maternal Grandfather   . Stomach cancer Maternal Grandfather    History  Substance Use Topics  . Smoking status: Never Smoker   . Smokeless tobacco: Never Used  . Alcohol Use: 3.0 oz/week    5 Cans of beer per week    Review of Systems A complete 10 system review of systems was obtained and all systems are negative except as noted in the HPI and PMH.   Allergies  Ciprofloxacin  Home Medications   Current Outpatient Rx  Name  Route  Sig  Dispense  Refill  . ALPRAZolam (XANAX) 0.25 MG tablet   Oral   Take 0.25 mg by mouth daily as needed. For anxiety          . buPROPion (WELLBUTRIN XL) 300 MG 24 hr  tablet   Oral   Take 300 mg by mouth daily.         Marland Kitchen HYDROcodone-acetaminophen (NORCO) 5-325 MG per tablet   Oral   Take 1 tablet by mouth every 6 (six) hours as needed. For pain         . omeprazole (PRILOSEC) 20 MG capsule   Oral   Take 20 mg by mouth daily.           . ondansetron (ZOFRAN) 4 MG tablet   Oral   Take 1 tablet (4 mg total) by mouth every 12 (twelve) hours as needed for nausea.   30 tablet   4   . EXPIRED: sucralfate (CARAFATE) 1 GM/10ML suspension   Oral   Take 10 mLs (1 g total) by mouth 4 (four) times daily.   420 mL   0    BP 127/91  Pulse 75  Temp(Src) 97.8 F (36.6 C) (Oral)  Resp 20  Ht 5\' 10"  (1.778 m)  Wt 220 lb (99.791 kg)  BMI 31.57 kg/m2  SpO2 98% Physical Exam  Nursing note and vitals reviewed.  Constitutional: He is oriented to person, place, and time. He appears well-developed and well-nourished.  HENT:  Head: Normocephalic and atraumatic.  Eyes: EOM are normal. Pupils are equal, round, and reactive to light.  Neck: Normal range of motion. Neck supple.  Cardiovascular: Normal rate, normal heart sounds and intact distal pulses.   Pulmonary/Chest: Effort normal. He has wheezes (prolonged expiratory phase).  Abdominal: Bowel sounds are normal. He exhibits no distension. There is no tenderness.  Musculoskeletal: Normal range of motion. He exhibits no edema and no tenderness.  Neurological: He is alert and oriented to person, place, and time. He has normal strength. No cranial nerve deficit or sensory deficit.  Skin: Skin is warm and dry. No rash noted.  Psychiatric: He has a normal mood and affect.    ED Course  Procedures   DIAGNOSTIC STUDIES: Oxygen Saturation is 98% on RA, normal by my interpretation.    COORDINATION OF CARE: 6:29 PM Discussed treatment plan with pt at bedside and pt agreed to plan.   Labs Review Labs Reviewed - No data to display Imaging Review No results found.  EKG Interpretation   None        MDM   1. Bronchitis     Pt with bronchitis, doubt PNA. Given inhaler, prednisone and cough medicine.   I personally performed the services described in this documentation, which was scribed in my presence. The recorded information has been reviewed and is accurate.      Charles B. Bernette Mayers, MD 03/02/13 2251

## 2013-11-04 IMAGING — CT CT ABD-PELV W/ CM
2 of 5 series · 17 of 46 positions shown, 19 images · IV contrast (READICAT/WATER & [ID] OMNI 300)
Comparison: CT abdomen pelvis of 03/01/2011

CLINICAL DATA: Lower abdominal pain, nausea, constipation, diarrhea

CT ABDOMEN AND PELVIS WITH CONTRAST
TECHNIQUE: Multidetector CT imaging of the abdomen and pelvis was
performed following the standard protocol during bolus
administration of intravenous contrast.
Contrast: 100mL OMNIPAQUE IOHEXOL 300 MG/ML  SOLN

[Series 2: abd/pelvis with · axial · 0.80mm/px · z∈[-330,+65]mm · 14 of 89 slices shown, 16 images]
[im 5/89  soft-tissue]
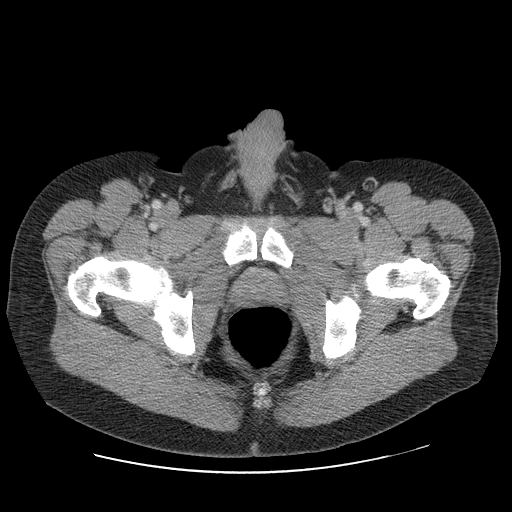
[im 5/89  bone]
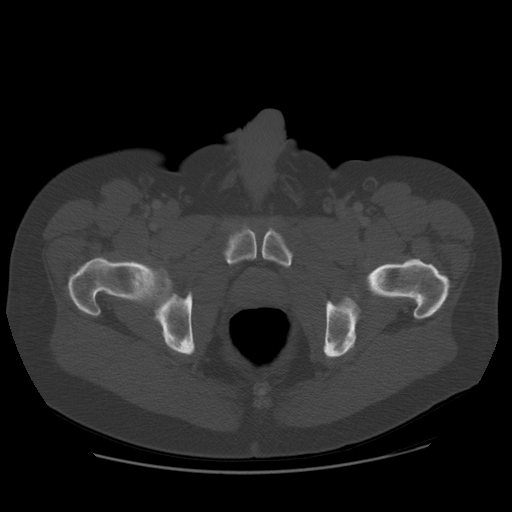
[im 10/89  soft-tissue]
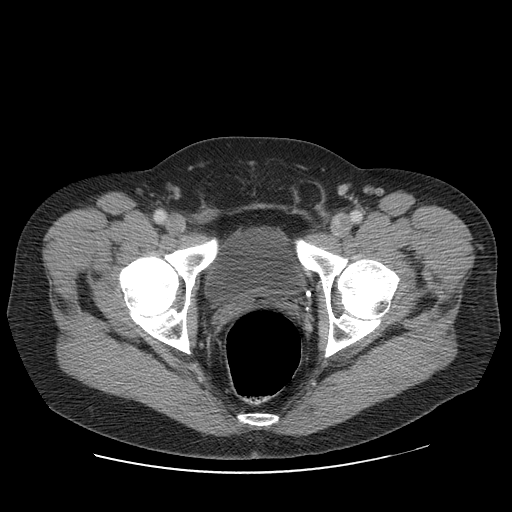
[im 19/89  soft-tissue]
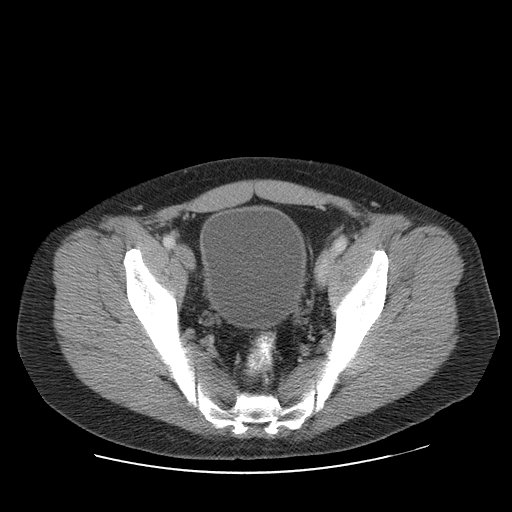
[im 24/89  soft-tissue]
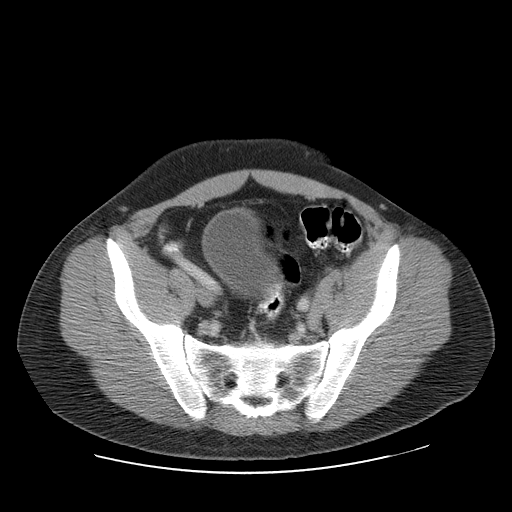
[im 28/89  soft-tissue]
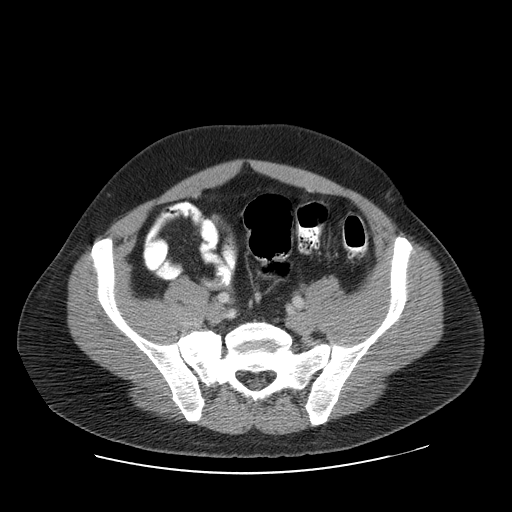
[im 38/89  soft-tissue]
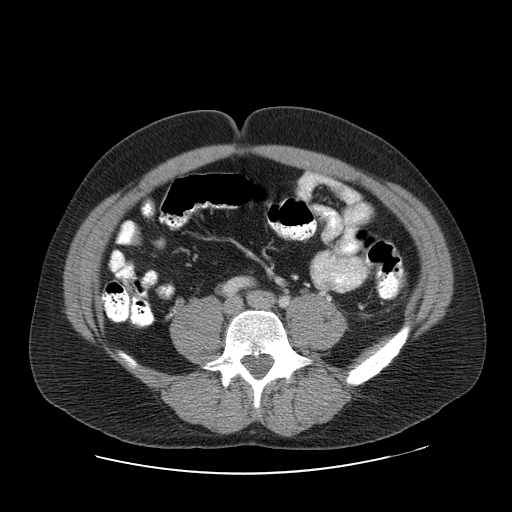
[im 42/89  soft-tissue]
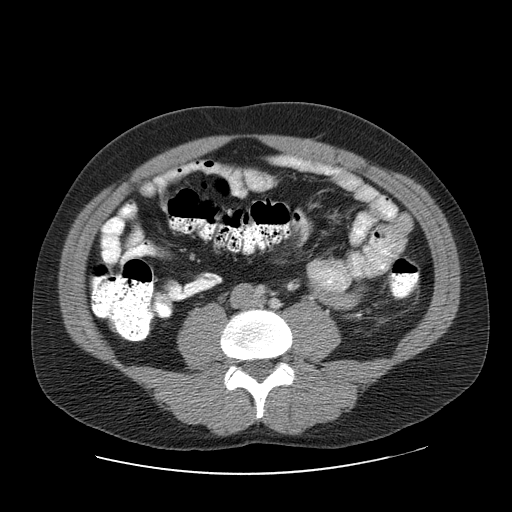
[im 47/89  soft-tissue]
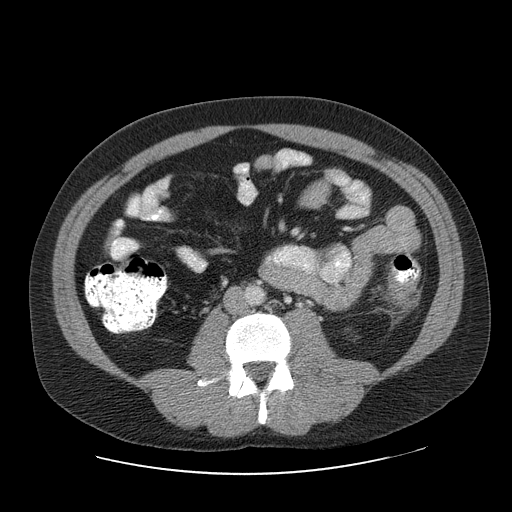
[im 51/89  soft-tissue]
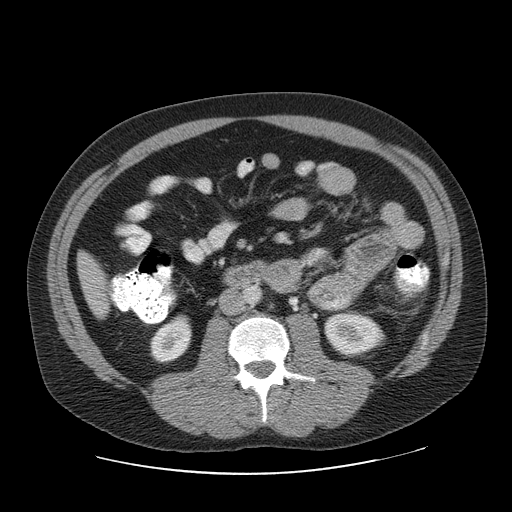
[im 51/89  bone]
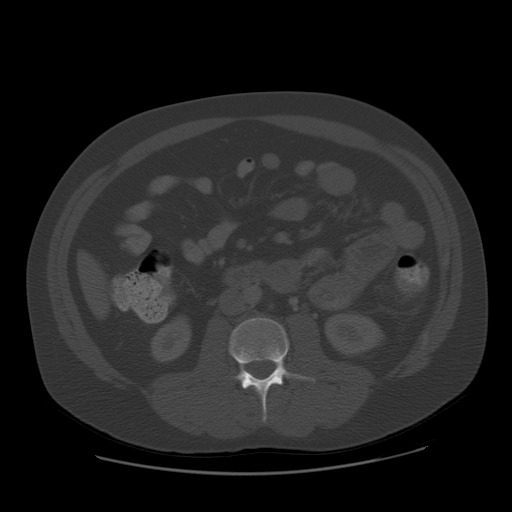
[im 61/89  soft-tissue]
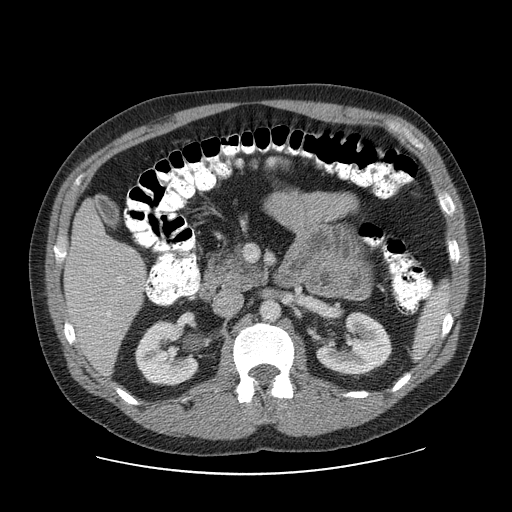
[im 65/89  soft-tissue]
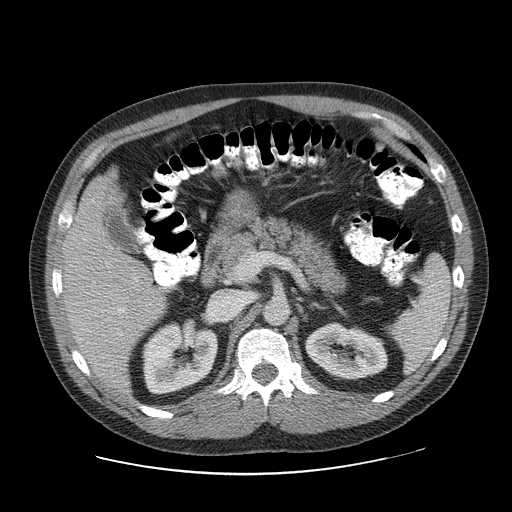
[im 70/89  soft-tissue]
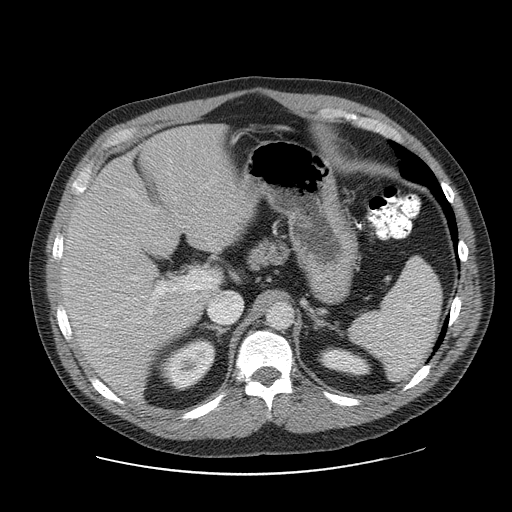
[im 79/89  soft-tissue]
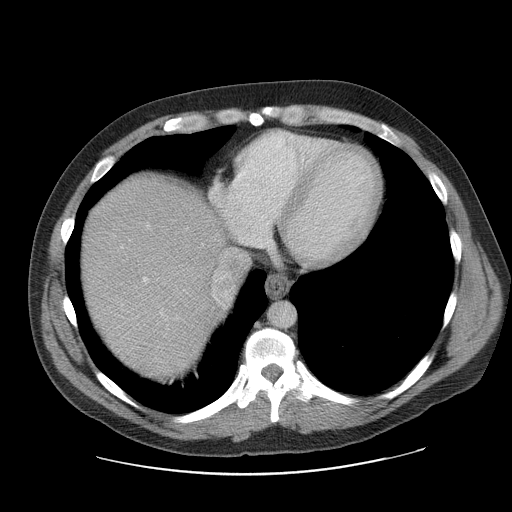
[im 84/89  soft-tissue]
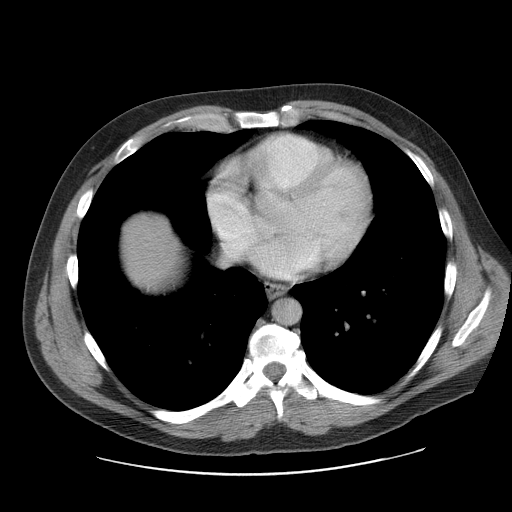

[Series 400: cor · coronal · 0.95mm/px · 3 of 134 slices shown]
[im 45/134  soft-tissue]
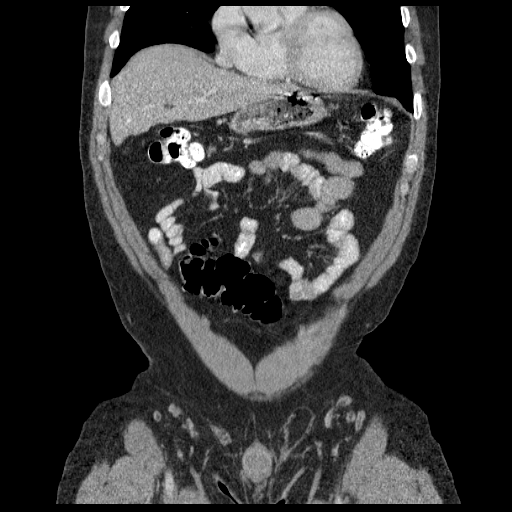
[im 60/134  soft-tissue]
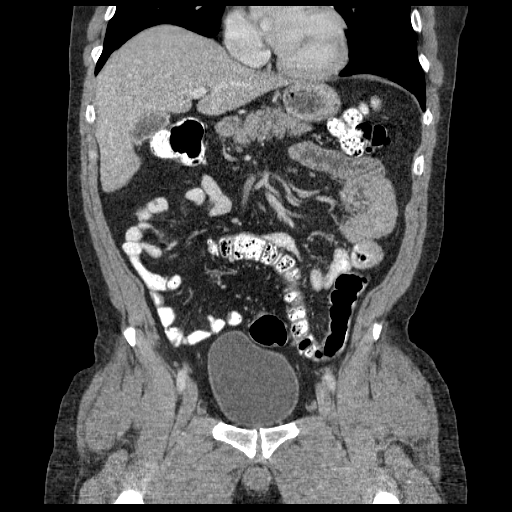
[im 74/134  soft-tissue]
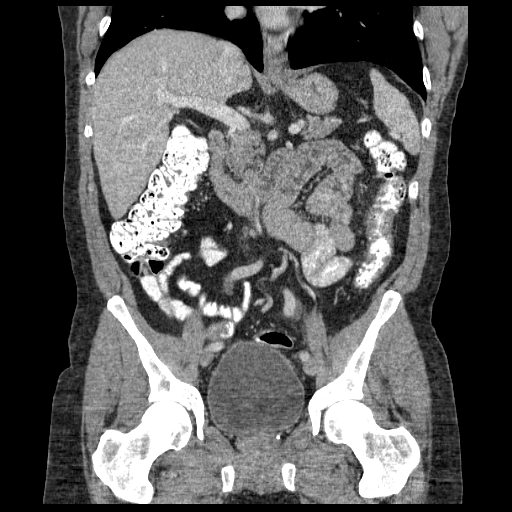

[17 of 46 positions shown; findings below may reference images not displayed]

FINDINGS: The lung bases are clear.  The liver enhances with no
focal abnormality and no ductal dilatation is seen.  No calcified
gallstones seen within the somewhat contracted gallbladder.  The
pancreas is normal in size and the pancreatic duct is not dilated.
The adrenal glands and spleen are unremarkable.  The stomach is not
well distended.  No definite renal calculi are seen and there is no
evidence of hydronephrosis.  The abdominal aorta is normal in
caliber.  No adenopathy is seen.  The mesenteric vasculature
appears patent.

However, there is mucosal thickening and pericolonic strandiness
within the mid descending colon.  This is most consistent with
focal diverticulitis of the mid descending colon with some
thickening of the lateral conal fascia.  No abscess is seen and no
perforation is noted.  There are scattered diverticula throughout
the rectosigmoid and descending colon.  The terminal ileum and
appendix are well seen and both appear normal.  The urinary bladder
is moderately urine distended with no abnormality noted.  The
prostate is normal in size.  No fluid is seen within the pelvis.
No bony abnormality is seen.
IMPRESSION: 1.  Mucosal thickening of the mid descending colon with pericolonic
strandiness most consistent with focal diverticulitis.  No abscess
is seen.
2.  The appendix and terminal ileum appear normal.

## 2014-05-02 ENCOUNTER — Emergency Department (HOSPITAL_BASED_OUTPATIENT_CLINIC_OR_DEPARTMENT_OTHER)
Admission: EM | Admit: 2014-05-02 | Discharge: 2014-05-02 | Disposition: A | Discharge status: Home / Self Care | Payer: BC Managed Care – PPO | Attending: Emergency Medicine | Admitting: Emergency Medicine

## 2014-05-02 ENCOUNTER — Emergency Department (HOSPITAL_BASED_OUTPATIENT_CLINIC_OR_DEPARTMENT_OTHER): Payer: BC Managed Care – PPO

## 2014-05-02 ENCOUNTER — Emergency Department (HOSPITAL_COMMUNITY): Payer: BC Managed Care – PPO

## 2014-05-02 ENCOUNTER — Encounter (HOSPITAL_BASED_OUTPATIENT_CLINIC_OR_DEPARTMENT_OTHER): Payer: Self-pay | Admitting: Emergency Medicine

## 2014-05-02 DIAGNOSIS — E119 Type 2 diabetes mellitus without complications: Secondary | ICD-10-CM | POA: Insufficient documentation

## 2014-05-02 DIAGNOSIS — Y9384 Activity, sleeping: Secondary | ICD-10-CM | POA: Diagnosis not present

## 2014-05-02 DIAGNOSIS — Y929 Unspecified place or not applicable: Secondary | ICD-10-CM | POA: Insufficient documentation

## 2014-05-02 DIAGNOSIS — Y998 Other external cause status: Secondary | ICD-10-CM | POA: Diagnosis not present

## 2014-05-02 DIAGNOSIS — W19XXXA Unspecified fall, initial encounter: Secondary | ICD-10-CM

## 2014-05-02 DIAGNOSIS — W06XXXA Fall from bed, initial encounter: Secondary | ICD-10-CM | POA: Diagnosis not present

## 2014-05-02 DIAGNOSIS — S0990XA Unspecified injury of head, initial encounter: Secondary | ICD-10-CM | POA: Insufficient documentation

## 2014-05-02 DIAGNOSIS — Y92009 Unspecified place in unspecified non-institutional (private) residence as the place of occurrence of the external cause: Secondary | ICD-10-CM

## 2014-05-02 DIAGNOSIS — S14109A Unspecified injury at unspecified level of cervical spinal cord, initial encounter: Secondary | ICD-10-CM

## 2014-05-02 DIAGNOSIS — M542 Cervicalgia: Secondary | ICD-10-CM

## 2014-05-02 LAB — CBC WITH DIFFERENTIAL/PLATELET
Basophils Absolute: 0 10*3/uL (ref 0.0–0.1)
Basophils Relative: 0 % (ref 0–1)
EOS ABS: 0.2 10*3/uL (ref 0.0–0.7)
EOS PCT: 2 % (ref 0–5)
HCT: 40.2 % (ref 39.0–52.0)
Hemoglobin: 14 g/dL (ref 13.0–17.0)
LYMPHS ABS: 2.5 10*3/uL (ref 0.7–4.0)
Lymphocytes Relative: 30 % (ref 12–46)
MCH: 31.5 pg (ref 26.0–34.0)
MCHC: 34.8 g/dL (ref 30.0–36.0)
MCV: 90.3 fL (ref 78.0–100.0)
MONO ABS: 0.7 10*3/uL (ref 0.1–1.0)
Monocytes Relative: 9 % (ref 3–12)
Neutro Abs: 4.9 10*3/uL (ref 1.7–7.7)
Neutrophils Relative %: 59 % (ref 43–77)
PLATELETS: 261 10*3/uL (ref 150–400)
RBC: 4.45 MIL/uL (ref 4.22–5.81)
RDW: 12.8 % (ref 11.5–15.5)
WBC: 8.4 10*3/uL (ref 4.0–10.5)

## 2014-05-02 LAB — COMPREHENSIVE METABOLIC PANEL
ALT: 23 U/L (ref 0–53)
ANION GAP: 10 (ref 5–15)
AST: 21 U/L (ref 0–37)
Albumin: 4.1 g/dL (ref 3.5–5.2)
Alkaline Phosphatase: 55 U/L (ref 39–117)
BUN: 14 mg/dL (ref 6–23)
CALCIUM: 9.2 mg/dL (ref 8.4–10.5)
CO2: 25 mmol/L (ref 19–32)
CREATININE: 1.17 mg/dL (ref 0.50–1.35)
Chloride: 102 mEq/L (ref 96–112)
GFR, EST AFRICAN AMERICAN: 86 mL/min — AB (ref >90)
GFR, EST NON AFRICAN AMERICAN: 74 mL/min — AB (ref >90)
GLUCOSE: 120 mg/dL — AB (ref 70–99)
Potassium: 3.6 mmol/L (ref 3.5–5.1)
Sodium: 137 mmol/L (ref 135–145)
TOTAL PROTEIN: 7.1 g/dL (ref 6.0–8.3)
Total Bilirubin: 0.3 mg/dL (ref 0.3–1.2)

## 2014-05-02 MED ORDER — METHOCARBAMOL 750 MG PO TABS: 750.0000 mg | ORAL_TABLET | Freq: Four times a day (QID) | ORAL | Status: AC

## 2014-05-02 MED ORDER — OXYCODONE-ACETAMINOPHEN 5-325 MG PO TABS
2.0000 | ORAL_TABLET | Freq: Once | ORAL | Status: AC
  Administered 2014-05-02: 2 via ORAL
  Filled 2014-05-02: qty 2

## 2014-05-02 MED ORDER — FENTANYL CITRATE 0.05 MG/ML IJ SOLN
50.0000 ug | Freq: Once | INTRAMUSCULAR | Status: AC
  Administered 2014-05-02: 50 ug via INTRAVENOUS

## 2014-05-02 MED ORDER — FENTANYL CITRATE 0.05 MG/ML IJ SOLN
INTRAMUSCULAR | Status: AC
  Filled 2014-05-02: qty 2

## 2014-05-02 MED ORDER — OXYCODONE-ACETAMINOPHEN 5-325 MG PO TABS: 1.0000 | ORAL_TABLET | ORAL | Status: AC | PRN

## 2014-05-02 MED ORDER — ONDANSETRON HCL 4 MG/2ML IJ SOLN
INTRAMUSCULAR | Status: AC
  Administered 2014-05-02: 4 mg
  Filled 2014-05-02: qty 2

## 2014-05-02 NOTE — ED Provider Notes (Addendum)
CSN: [id number]     Arrival date & time 05/02/14  0200 History   First MD Initiated Contact with Patient 05/02/14 0228     Chief Complaint  Patient presents with  . Fall     (Consider location/radiation/quality/duration/timing/severity/associated sxs/prior Treatment) HPI Patient was in his normal state of health when he went to bed this evening. Valentina LucksGriffin states the patient rolled out of the bed and struck his head on the bedside table. Loss of consciousness for several minutes. No seizure-like activity. Patient did had some bleeding from his mouth and nose. When he woke he vomited with gross blood in vomit. Currently the patient is complaining of headache and posterior neck pain. He denies any visual changes. No focal weakness or numbness. No abdominal pain. Past Medical History  Diagnosis Date  . GERD (gastroesophageal reflux disease)   . Back pain   . Diverticulitis   . Depression   . Anxiety   . Hyperlipidemia   . Seizures     AS CHILD AGE 53   Past Surgical History  Procedure Laterality Date  . Joint replacement    . Back surgery  05/2012  . Acl replacement  2004     right knee  . Upper gastrointestinal endoscopy    . Colonoscopy     Family History  Problem Relation Age of Onset  . Diabetes Brother     x2  . Diabetes Maternal Grandfather   . Stomach cancer Maternal Grandfather    History  Substance Use Topics  . Smoking status: Never Smoker   . Smokeless tobacco: Never Used  . Alcohol Use: 3.0 oz/week    5 Cans of beer per week    Review of Systems  Constitutional: Negative for fever and chills.  Eyes: Negative for visual disturbance.  Respiratory: Negative for shortness of breath.   Cardiovascular: Negative for chest pain.  Gastrointestinal: Positive for nausea and vomiting. Negative for abdominal pain and diarrhea.  Musculoskeletal: Positive for neck pain. Negative for back pain and neck stiffness.  Skin: Positive for wound. Negative for rash.  Neurological:  Positive for syncope and headaches. Negative for seizures, weakness and numbness.  All other systems reviewed and are negative.     Allergies  Ciprofloxacin  Home Medications   Prior to Admission medications   Medication Sig Start Date End Date Taking? Authorizing Provider  ALPRAZolam (XANAX) 0.25 MG tablet Take 0.25 mg by mouth daily as needed. For anxiety     Historical Provider, MD  buPROPion (WELLBUTRIN XL) 300 MG 24 hr tablet Take 300 mg by mouth daily.    Historical Provider, MD  chlorpheniramine-HYDROcodone (TUSSIONEX PENNKINETIC ER) 10-8 MG/5ML LQCR Take 5 mLs by mouth every 12 (twelve) hours as needed (cough). 03/02/13   Charles B. Bernette MayersSheldon, MD  omeprazole (PRILOSEC) 20 MG capsule Take 20 mg by mouth daily.      Historical Provider, MD  ondansetron (ZOFRAN) 4 MG tablet Take 1 tablet (4 mg total) by mouth every 12 (twelve) hours as needed for nausea. 11/19/12   Rachael Feeaniel P Jacobs, MD  predniSONE (DELTASONE) 20 MG tablet Take 3 tablets (60 mg total) by mouth daily. 03/02/13   Charles B. Bernette MayersSheldon, MD  sucralfate (CARAFATE) 1 GM/10ML suspension Take 10 mLs (1 g total) by mouth 4 (four) times daily. 12/24/11 01/23/12  April K Palumbo-Rasch, MD   BP 146/94 mmHg  Pulse 87  Temp(Src) 98.2 F (36.8 C) (Oral)  Resp 20  Ht 5\' 10"  (1.778 m)  Wt 235 lb (106.595  kg)  BMI 33.72 kg/m2  SpO2 98% Physical Exam  Constitutional: He is oriented to person, place, and time. He appears well-developed and well-nourished. No distress.  HENT:  Head: Normocephalic.  Mouth/Throat: Oropharynx is clear and moist.  Mild abrasion to the left parietal and frontal region. Tenderness to palpation without deformity. Midface is stable. Patient with contusion to the distal tip of the tongue. There is no active bleeding.  Eyes: EOM are normal. Pupils are equal, round, and reactive to light.  Neck: Normal range of motion. Neck supple.  Mild diffuse posterior cervical tenderness without specific midline tenderness.   Cardiovascular: Normal rate and regular rhythm.   Pulmonary/Chest: Effort normal and breath sounds normal. No respiratory distress. He has no wheezes. He has no rales.  Abdominal: Soft. Bowel sounds are normal. He exhibits no distension and no mass. There is no tenderness. There is no rebound and no guarding.  Musculoskeletal: Normal range of motion. He exhibits no edema or tenderness.  Neurological: He is alert and oriented to person, place, and time.  5/5 motor in all extremities. Sensation is intact.  Skin: Skin is warm and dry. No rash noted. No erythema.  Psychiatric: He has a normal mood and affect. His behavior is normal.  Nursing note and vitals reviewed.   ED Course  Procedures (including critical care time) Labs Review Labs Reviewed - No data to display  Imaging Review No results found.   EKG Interpretation None      MDM   Final diagnoses:  Head trauma   Questionable nondisplaced fracture at the level of C7 on CT. Patient does have tenderness at C7 though the tenderness appears to be diffuse. Cervical collar placed in the emergency department. D[facility] with [facility] and will accept in transfer for MRI of the cervical spine to rule out acute fracture. Patient's continues to have no neurologic deficits.     Loren Raceravid Joyelle Siedlecki, MD 05/02/14 0413  Prior to transfer patient became mildly diaphoretic and blood pressure dropped into the 90s. Patient's pulse was in the 60s. This improved spontaneously. No history of previous syncopal reactions.  Loren Raceravid Analleli Gierke, MD 05/05/14 [phone]

## 2014-05-02 NOTE — ED Provider Notes (Signed)
Patient signed out to me by Dr. Preston FleetingGlick and MRI reviewed with neurosurgeon on call. Patient will be placed and a hard collar and will be instructed to see the neurosurgeon in one month  Toy BakerAnthony T Lakishia Bourassa, MD 05/02/14 1152

## 2014-05-02 NOTE — ED Provider Notes (Signed)
43 year old male who had a fall from bed at home last night. Seen at [facility] where CT of cervical spine showed questionable fracture at facet of C7 and was transferred here for MRI scan. MRI has been ordered. Patient is resting comfortably with stiff cervical collar in place. Neck is tender diffusely without point tenderness. There are no sensory or motor deficits.  Dione Boozeavid Mickelle Goupil, MD 05/02/14 [phone]

## 2014-05-02 NOTE — ED Notes (Signed)
[phone]  Angie (Pt Wife)

## 2014-05-02 NOTE — ED Notes (Signed)
Patient transported to MRI 

## 2014-05-02 NOTE — ED Notes (Signed)
Pt and girlfriend report that he fail out of bed strike head against nite stand resulting in LOC for about 3 minutes girlfriend reports. Pt state having N/V with bloody emesis

## 2014-05-02 NOTE — Discharge Instructions (Signed)
Wear the [place] collar at all times except for when you are taking a shower and then he will need to use the [place] collar. Called Dr. Earl GalaNudelman's office on Monday to schedule appointment to be seen in one month

## 2014-11-10 ENCOUNTER — Encounter: Payer: Self-pay | Admitting: Gastroenterology

## 2015-01-05 ENCOUNTER — Ambulatory Visit: Payer: BLUE CROSS/BLUE SHIELD | Attending: Neurosurgery | Admitting: Physical Therapy

## 2015-01-05 ENCOUNTER — Encounter: Payer: Self-pay | Admitting: Physical Therapy

## 2015-01-05 DIAGNOSIS — M25612 Stiffness of left shoulder, not elsewhere classified: Secondary | ICD-10-CM | POA: Insufficient documentation

## 2015-01-05 DIAGNOSIS — M542 Cervicalgia: Secondary | ICD-10-CM | POA: Insufficient documentation

## 2015-01-05 DIAGNOSIS — M25611 Stiffness of right shoulder, not elsewhere classified: Secondary | ICD-10-CM
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
## 2015-01-07 ENCOUNTER — Ambulatory Visit: Payer: BLUE CROSS/BLUE SHIELD | Attending: Neurosurgery | Admitting: Physical Therapy

## 2015-01-13 ENCOUNTER — Ambulatory Visit: Payer: BLUE CROSS/BLUE SHIELD | Admitting: Physical Therapy

## 2015-01-19 ENCOUNTER — Ambulatory Visit: Payer: BLUE CROSS/BLUE SHIELD | Admitting: Physical Therapy

## 2015-01-21 ENCOUNTER — Ambulatory Visit: Payer: BLUE CROSS/BLUE SHIELD | Admitting: Physical Therapy

## 2015-01-27 ENCOUNTER — Encounter: Payer: BLUE CROSS/BLUE SHIELD | Admitting: Physical Therapy

## 2015-01-29 ENCOUNTER — Encounter: Payer: BLUE CROSS/BLUE SHIELD | Admitting: Physical Therapy

## 2015-02-22 ENCOUNTER — Ambulatory Visit: Payer: BLUE CROSS/BLUE SHIELD | Attending: Neurosurgery

## 2015-02-22 DIAGNOSIS — M25612 Stiffness of left shoulder, not elsewhere classified: Secondary | ICD-10-CM | POA: Diagnosis present

## 2015-02-22 DIAGNOSIS — M545 Low back pain: Secondary | ICD-10-CM | POA: Diagnosis present

## 2015-02-22 DIAGNOSIS — M25611 Stiffness of right shoulder, not elsewhere classified: Secondary | ICD-10-CM | POA: Diagnosis present

## 2015-02-22 DIAGNOSIS — M542 Cervicalgia: Secondary | ICD-10-CM

## 2015-02-22 NOTE — Patient Instructions (Signed)
On Elbows (Prone)    Rise up on elbows  keeping hips on bed. Feet turned in. Take deep breaths. Perform for __2 min. Repeat __2__ times per set.  Do ___2_ sessions per day.  http://orth.exer.us/92   Copyright  VHI. All rights reserved.

## 2015-02-22 NOTE — Therapy (Signed)
[facility] [address] [place], [address] Phone: [phone]   Fax:  [phone]  Physical Therapy Treatment And  Progress Report  Patient Details  Name: Daniel Morrison MRN: [id number] Date of Birth: 1970-10-22 Referring Provider: Earnie Larsson, MD  Encounter Date: 02/22/2015      PT End of Session - 02/22/15 1633    Visit Number 2   Number of Visits 9   Date for PT Re-Evaluation 02/05/15   PT Start Time 1633   PT Stop Time 1732   PT Time Calculation (min) 59 min   Activity Tolerance Patient tolerated treatment well   Behavior During Therapy [facility] for tasks assessed/performed      Past Medical History  Diagnosis Date  . GERD (gastroesophageal reflux disease)   . Back pain   . Diverticulitis   . Depression   . Anxiety   . Hyperlipidemia   . Seizures     AS CHILD AGE 59    Past Surgical History  Procedure Laterality Date  . Joint replacement    . Back surgery  05/2012  . Acl replacement  2004     right knee  . Upper gastrointestinal endoscopy    . Colonoscopy      There were no vitals filed for this visit.  Visit Diagnosis:  Cervicalgia - Plan: PT plan of care cert/re-cert  Shoulder stiffness, left - Plan: PT plan of care cert/re-cert  Shoulder stiffness, right - Plan: PT plan of care cert/re-cert  Low back pain, unspecified back pain laterality, with sciatica presence unspecified - Plan: PT plan of care cert/re-cert      Subjective Assessment - 02/22/15 1635    Subjective Neck pain current: 2/10. Neck pain tends to increase when trying to look at different angles at work. Gets headaches at the back of his head. Can tell that his neck is causing his headache. Has a posterior headache 2/10 currently.  Pt also adds feeling low back pain. Back pain current: 3/10, best: 2/10, worst 6/10  (usually at night, and bending over). Currently has difficulty with picking up his feet, and bending over to  pick up an object from the floor due to back pain.  Had an X-ray for his back 1.5 months ago (August 2016 which revealed arthritis). Also had an X-ray and MRI for his L shoulder which revealed three rotator cuff tears.  Pt also adds that he feels restlessness for his legs at night.  Pt adds having surgery for his bulging disc in his low back 2014 (microdiscectomy) which helped.     Pertinent History Pt reports h/o chronic neck pain. 2 yrs ago pt reports he fx one cervical vertebra. He had pain prior to this, describes his pain as coming and going. He did have chiropractic which helped, also has had injections which help for short periods of time. Pt reports pain going down R UE and in L shoulder. Pt has HA daily. Pt feels better with ibuprofen and percocet.  Pt is able to work despite pain, however after work he is unable to do anything because the pain "drains his energy". Pt also reports concentration difficulty.   Limitations Sitting;Lifting   How long can you sit comfortably? 30 min   Diagnostic tests Pt had an MRI 3 months ago which he reports indicated he had bone spurs.   Patient Stated Goals Pain relief and incr. energy at the end of the day. Back goal: to be  able to sleep and go to the gym.    Currently in Pain? Yes   Pain Score 2    Pain Onset More than a month ago   Aggravating Factors  neck: sitting with laptop in lap. back: picking up his feet, bending over to pick up something from the floor.    Pain Relieving Factors neck: heating pad. back heating pad sitting, leaning back.    Multiple Pain Sites No       There-ex:  Directed patient with cervical flexion, extension, side bend (R and L), rotation (R and L) 1x each way Bilateral shoulder flexion 1x each UE Standing lumbar flexion, extension, side bend (R and L), rotation (R and L) 1x each way,  L lateral shift correction 5x5 seconds, then 5x 10 second holds, Prone on elbows x 2 minutes with bilateral hip in IR with deep breaths for  2 sets (decreased back pain)  Standing bilateral scapular retraction resisting red band 10x5 seconds Standing back extension with L trunk rotation 10x5 seconds,  Standing (to promote back extension) chin tucks 10x5 second holds  Reviewed HEP with pt.   Improved exercise technique, movement at target joints, use of target muscles after mod verbal, visual, tactile cues.     Pt demonstrates improved neck pain, ability to move his neck and improved shoulder flexion AROM since initial evaluation.  He also presents with low back pain in which symptoms are reproduced with lumbar flexion, and R side bending and decreases with extension and L side bending suggesting disc pathology. He also had a significant decrease in back pain (less than 1/10) after extension based exercises for his low back. Patient will benefit from continued skilled physical therapy services to decrease neck pain as well as to decrease back pain to promote ability to perform work related tasks and improve ability to perform activities after work.             [facility] PT Assessment - 02/22/15 1639    Assessment   Referring Provider Earnie Larsson, MD   Posture/Postural Control   Posture Comments Movement crease/preference to C5/C6 joint. Standing: L lateral shift, bilaterally protracted shoulders upper thoracic kyphosis, decreased lordosis   AROM   Overall AROM Comments shoulder flexion: R 168 degrees, L 165   minimal complain of pain   Cervical Flexion full with L posterior neck pulling at C5/6   Cervical Extension 50 % limited with discomfort at C5/6 (less discomfort compared to flexion)   Cervical - Right Side Bend limited with R upper trap discomfort   Cervical - Left Side Bend more limited than R SB with R upper trap and C5/6 pain   Cervical - Right Rotation full with L upper trap pulling sensation   Cervical - Left Rotation WFL (slightly limited compared to R rotation) with L upper trap pull   Lumbar Flexion WFL with L Low  back pain.    Lumbar Extension limited with L low back pain   Lumbar - Right Side Bend limited with L Low back pain   Lumbar - Left Side Bend limited   Lumbar - Right Rotation limited with L glute and proximal thigh discomfort   Lumbar - Left Rotation limited but more movement compared to R rotation.    Palpation   Palpation comment R rotated C6                             PT Education -  02/22/15 1917    Education provided Yes   Education Details ther-ex, HEP, low back disc   Person(s) Educated Patient   Methods Explanation;Demonstration;Tactile cues;Verbal cues;Handout   Comprehension Returned demonstration;Verbalized understanding             PT Long Term Goals - 02/22/15 1638    PT LONG TERM GOAL #1   Title Pt will report HA no greater than 2 per week.   Baseline Currently pt reports HA daily.   Time 4   Period Weeks   Status On-going   PT LONG TERM GOAL #2   Title Pt will have full ROM pain free in neck to allow for pain free driving.   Time 4   Period Weeks   Status Partially Met   PT LONG TERM GOAL #3   Title Pt will demonstrate full shoulder flexion.   Time 4   Period Weeks   Status On-going   PT LONG TERM GOAL #4   Title Patient will have a decrease in low back pain to 3/10 or less at worst to promote ability to sleep, and pick up items from the floor   Time 4   Period Weeks   Status New   PT LONG TERM GOAL #5   Title Patient will be able to pick up a 5 lbs weight from the floor at least 5 times with proper ergonomics with 2/10 or less back pain to promote ability to pick up items from the floor at home.   Time 4   Period Weeks   Status New               Plan - 02/22/15 1632    Clinical Impression Statement Pt demonstrates improved neck pain,  slight improvement in ability to move his neck and improved shoulder flexion AROM since initial  evaluation.  He also presents with low back pain in which symptoms are  reproduced  with lumbar flexion, and R side bending and decreases with  extension and L side bending suggesting disc pathology. He also had a  significant decrease in back pain (less than 1/10) after extension based  exercises for his low back. Patient will benefit from continued skilled  physical therapy services to decrease neck pain as well as to decrease  back pain to promote ability to perform work related tasks and improve  ability to perform activities after work.      Pt will benefit from skilled therapeutic intervention in order to improve on the following deficits Impaired flexibility;Improper body mechanics;Pain;Decreased range of motion;Decreased strength   Rehab Potential Good   PT Frequency 2x / week   PT Duration 4 weeks   PT Treatment/Interventions Manual techniques;Therapeutic exercise;Passive range of motion;Dry needling   Consulted and Agree with Plan of Care Patient        Problem List Patient Active Problem List   Diagnosis Date Noted  . DIVERTICULITIS OF COLON 05/13/2007   Thank you for your referral.   Joneen Boers PT, DPT   02/22/2015, 7:42 PM  [facility] [address] [place], [address] Phone: [phone]   Fax:  [phone]  Name: Daniel Morrison MRN: [id number] Date of Birth: Aug 21, 1970

## 2015-02-24 ENCOUNTER — Ambulatory Visit: Payer: BLUE CROSS/BLUE SHIELD

## 2015-02-24 DIAGNOSIS — M542 Cervicalgia: Secondary | ICD-10-CM

## 2015-02-24 DIAGNOSIS — M25611 Stiffness of right shoulder, not elsewhere classified: Secondary | ICD-10-CM

## 2015-02-24 DIAGNOSIS — M545 Low back pain: Secondary | ICD-10-CM

## 2015-02-24 DIAGNOSIS — M25612 Stiffness of left shoulder, not elsewhere classified: Secondary | ICD-10-CM

## 2015-02-24 NOTE — Therapy (Signed)
[place] [facility] [address], [place], [address] Phone: [phone]   Fax:  [phone]  Physical Therapy Treatment  Patient Details  Name: Daniel Morrison MRN: [id number] Date of Birth: [birth year] Referring Provider: Earnie Larsson, MD  Encounter Date: 02/24/2015      PT End of Session - 02/24/15 1650    Visit Number 3   Number of Visits 17   Date for PT Re-Evaluation 03/47/42  per recert   PT Start Time 5956   PT Stop Time 1734   PT Time Calculation (min) 44 min   Activity Tolerance Patient tolerated treatment well   Behavior During Therapy [facility] for tasks assessed/performed      Past Medical History  Diagnosis Date  . GERD (gastroesophageal reflux disease)   . Back pain   . Diverticulitis   . Depression   . Anxiety   . Hyperlipidemia   . Seizures     AS CHILD AGE 40    Past Surgical History  Procedure Laterality Date  . Joint replacement    . Back surgery  05/2012  . Acl replacement  2004     right knee  . Upper gastrointestinal endoscopy    . Colonoscopy      There were no vitals filed for this visit.  Visit Diagnosis:  Cervicalgia  Shoulder stiffness, left  Shoulder stiffness, right  Low back pain, unspecified back pain laterality, with sciatica presence unspecified      Subjective Assessment - 02/24/15 1653    Subjective Neck pain currently 3/10, back pain currenlty 1/10, back pain at most for the past 2 days 3/10. Better able to sleep.   Pertinent History Pt reports h/o chronic neck pain. 2 yrs ago pt reports he fx one cervical vertebra. He had pain prior to this, describes his pain as coming and going. He did have chiropractic which helped, also has had injections which help for short periods of time. Pt reports pain going down R UE and in L shoulder. Pt has HA daily. Pt feels better with ibuprofen and percocet.  Pt is able to work despite pain, however after work he is unable to do anything  because the pain "drains his energy". Pt also reports concentration difficulty.   Limitations Sitting;Lifting   How long can you sit comfortably? 30 min   Diagnostic tests Pt had an MRI 3 months ago which he reports indicated he had bone spurs.   Patient Stated Goals Pain relief and incr. energy at the end of the day. Back goal: to be able to sleep and go to the gym.    Currently in Pain? Yes   Pain Score --  please see subjective   Pain Onset More than a month ago      Objectives: There-ex  Directed patient with standing bilateral scapular retraction resisting red band 9x5 seconds (slight increase in neck discomfort) Standing R shoulder extension with scapular retraction resisting red band 10x2 with 5 second holds Seated R levator scapula muscle stretch 15 seconds x 5 (reviewed and given as part of his HEP and to perform 2x/day; pt demonstrated and verbalized understanding), Supine bilateral shoulder horizontal abduction 10x5 seconds to promote upper thoracic extension for 2 sets Sitting with proper posture x 1 min to promote proper lumbar and cervical position.   Improved exercise technique, movement at target joints, use of target muscles after mod verbal, visual, tactile cues.    Manual therapy:  Seated manual cervical  traction (decreased neck pain to 1/10 and decreased R UE paresthesia) Soft tissue mobilzation to R levator scapula muscle distal attatchement (decreased R UE paresthesia)    Improved neck posture (decreased R side bending) after manual cervical traction. Significant decrease in neck and R UE symptoms after session. Decreased back pain to 1/10 after session          [facility] PT Assessment - 02/24/15 1711    Observation/Other Assessments   Observations (-) VBI, (-) alar ligament tests                             PT Education - 02/24/15 1656    Education provided Yes   Education Details ther-ex, HEP   Person(s) Educated Patient   Methods  Explanation;Demonstration;Tactile cues;Verbal cues;Handout   Comprehension Returned demonstration;Verbalized understanding             PT Long Term Goals - 02/22/15 1638    PT LONG TERM GOAL #1   Title Pt will report HA no greater than 2 per week.   Baseline Currently pt reports HA daily.   Time 4   Period Weeks   Status On-going   PT LONG TERM GOAL #2   Title Pt will have full ROM pain free in neck to allow for pain free driving.   Time 4   Period Weeks   Status Partially Met   PT LONG TERM GOAL #3   Title Pt will demonstrate full shoulder flexion.   Time 4   Period Weeks   Status On-going   PT LONG TERM GOAL #4   Title Patient will have a decrease in low back pain to 3/10 or less at worst to promote ability to sleep, and pick up items from the floor   Time 4   Period Weeks   Status New   PT LONG TERM GOAL #5   Title Patient will be able to pick up a 5 lbs weight from the floor at least 5 times with proper ergonomics with 2/10 or less back pain to promote ability to pick up items from the floor at home.   Time 4   Period Weeks   Status New               Plan - 02/24/15 1650    Clinical Impression Statement Improved neck posture (decreased R side bending) after manual cervical traction. Significant decrease in neck and R UE symptoms after session. Decreased back pain to 1/10 after session    Pt will benefit from skilled therapeutic intervention in order to improve on the following deficits Impaired flexibility;Improper body mechanics;Pain;Decreased range of motion;Decreased strength   Rehab Potential Good   PT Frequency 2x / week   PT Duration 4 weeks   PT Treatment/Interventions Manual techniques;Therapeutic exercise;Passive range of motion;Dry needling   Consulted and Agree with Plan of Care Patient        Problem List Patient Active Problem List   Diagnosis Date Noted  . DIVERTICULITIS OF COLON 05/13/2007   Joneen Boers PT, DPT  02/24/2015, 7:09  PM  [facility] [address] [place], [address] Phone: [phone]   Fax:  [phone]  Name: Daniel Morrison MRN: [id number] Date of Birth: 12-12-1970

## 2015-02-24 NOTE — Patient Instructions (Signed)
Levator Scapula Stretch, Sitting    Sit, one hand tucked under hip on side to be stretched, other hand over top of head. Turn head toward other side and look down. Use hand on head to gently stretch neck in that position. Hold _15__ seconds. Repeat _5__ times per session. Do __2_ sessions per day.  Copyright  VHI. All rights reserved.

## 2015-03-01 ENCOUNTER — Ambulatory Visit: Payer: BLUE CROSS/BLUE SHIELD

## 2015-03-01 DIAGNOSIS — M542 Cervicalgia: Secondary | ICD-10-CM

## 2015-03-01 DIAGNOSIS — M25611 Stiffness of right shoulder, not elsewhere classified: Secondary | ICD-10-CM

## 2015-03-01 DIAGNOSIS — M545 Low back pain: Secondary | ICD-10-CM

## 2015-03-01 DIAGNOSIS — M25612 Stiffness of left shoulder, not elsewhere classified: Secondary | ICD-10-CM

## 2015-03-01 NOTE — Patient Instructions (Signed)
Recommended pt to try standing back extensions when his back bothers him, especially while at work as well as to continue his HEP. Pt demonstrated and verbalized understanding.

## 2015-03-01 NOTE — Therapy (Signed)
[facility] [address] [place], [address] Phone: [phone]   Fax:  [phone]  Physical Therapy Treatment  Patient Details  Name: Daniel Morrison MRN: [id number] Date of Birth: 45/23/1972 Referring Provider: Earnie Larsson, MD  Encounter Date: 03/01/2015      PT End of Session - 03/01/15 1649    Visit Number 4   Number of Visits 17   Date for PT Re-Evaluation 69/62/95  per recert   PT Start Time 2841   PT Stop Time 1738   PT Time Calculation (min) 48 min   Activity Tolerance Patient tolerated treatment well   Behavior During Therapy [facility] for tasks assessed/performed      Past Medical History  Diagnosis Date  . GERD (gastroesophageal reflux disease)   . Back pain   . Diverticulitis   . Depression   . Anxiety   . Hyperlipidemia   . Seizures     AS CHILD AGE 44    Past Surgical History  Procedure Laterality Date  . Joint replacement    . Back surgery  05/2012  . Acl replacement  2004     right knee  . Upper gastrointestinal endoscopy    . Colonoscopy      There were no vitals filed for this visit.  Visit Diagnosis:  Cervicalgia  Shoulder stiffness, left  Shoulder stiffness, right  Low back pain, unspecified back pain laterality, with sciatica presence unspecified      Subjective Assessment - 03/01/15 1652    Subjective Neck and back bothered him overnight. Back pain bothering him more today than his neck. Woke up on his R side. 3/10 neck pain currently. 4-5/10 back pain currently. L side of his back bothers him more currently. Getting injections for  his back tomorrow morning at 9:30 to help with his arthritis.    Pertinent History Pt reports h/o chronic neck pain. 2 yrs ago pt reports he fx one cervical vertebra. He had pain prior to this, describes his pain as coming and going. He did have chiropractic which helped, also has had injections which help for short periods of time. Pt reports  pain going down R UE and in L shoulder. Pt has HA daily. Pt feels better with ibuprofen and percocet.  Pt is able to work despite pain, however after work he is unable to do anything because the pain "drains his energy". Pt also reports concentration difficulty.   Limitations Sitting;Lifting   How long can you sit comfortably? 30 min   Diagnostic tests Pt had an MRI 3 months ago which he reports indicated he had bone spurs.   Patient Stated Goals Pain relief and incr. energy at the end of the day. Back goal: to be able to sleep and go to the gym.    Currently in Pain? Yes   Pain Score --  please see eval   Pain Onset More than a month ago   Multiple Pain Sites No      Objectives: There-ex  Directed patient with sitting with lumbar towel roll x 2 min,  Then with seated L hip extension isometrics (to promote use of L paraspinal muscles and decrease R paraspinal muscle activation) 10x5 seconds,  Prone on elbows with bilateral hip ER x 3 min, then 1 min with deep breaths throughout,  Supine bilateral shoulder horizontal abduction 10x5 seconds to promote upper thoracic extension for 2 sets, Standing R shoulder extension with scapular retraction 10x 5 seconds  for 2 sets Standing with good posture: bilateral shoulder ER resisting red band with scapular retraction 10x2    Improved exercise technique, movement at target joints, use of target muscles after mod verbal, visual, tactile cues.    Manual therapy:  Soft tissue mobilzation to L rhomboid (decreased neck pain to 1/10 afterwards)     Increased R paraspinal activation compared to the L upon palpation. Decreased neck pain to 1/10 after soft tissue mobilization to L rhomboid muscles. Decreased back pain to 1/10 after session.                                PT Education - 03/01/15 1704    Education provided Yes   Education Details ther-ex   Person(s) Educated Patient   Methods  Explanation;Demonstration;Verbal cues   Comprehension Verbalized understanding;Returned demonstration             PT Long Term Goals - 02/22/15 1638    PT LONG TERM GOAL #1   Title Pt will report HA no greater than 2 per week.   Baseline Currently pt reports HA daily.   Time 4   Period Weeks   Status On-going   PT LONG TERM GOAL #2   Title Pt will have full ROM pain free in neck to allow for pain free driving.   Time 4   Period Weeks   Status Partially Met   PT LONG TERM GOAL #3   Title Pt will demonstrate full shoulder flexion.   Time 4   Period Weeks   Status On-going   PT LONG TERM GOAL #4   Title Patient will have a decrease in low back pain to 3/10 or less at worst to promote ability to sleep, and pick up items from the floor   Time 4   Period Weeks   Status New   PT LONG TERM GOAL #5   Title Patient will be able to pick up a 5 lbs weight from the floor at least 5 times with proper ergonomics with 2/10 or less back pain to promote ability to pick up items from the floor at home.   Time 4   Period Weeks   Status New               Plan - 03/01/15 1705    Clinical Impression Statement Increased R paraspinal activation compared to the L upon palpation. Decreased neck pain to 1/10 after soft tissue mobilization to L rhomboid muscles. Decreased back pain to 1/10 after session.    Pt will benefit from skilled therapeutic intervention in order to improve on the following deficits Impaired flexibility;Improper body mechanics;Pain;Decreased range of motion;Decreased strength   Rehab Potential Good   PT Frequency 2x / week   PT Duration 4 weeks   PT Treatment/Interventions Manual techniques;Therapeutic exercise;Passive range of motion;Dry needling   Consulted and Agree with Plan of Care Patient        Problem List Patient Active Problem List   Diagnosis Date Noted  . DIVERTICULITIS OF COLON 05/13/2007   Joneen Boers PT, DPT  03/01/2015, 6:09 PM  [facility] [address], [place], [address] Phone: [phone]   Fax:  [phone]  Name: Latasha Puskas MRN: [id number] Date of Birth: [birth year]

## 2015-03-03 ENCOUNTER — Ambulatory Visit: Payer: BLUE CROSS/BLUE SHIELD

## 2015-03-08 ENCOUNTER — Ambulatory Visit: Payer: BLUE CROSS/BLUE SHIELD

## 2015-03-10 ENCOUNTER — Ambulatory Visit: Payer: BLUE CROSS/BLUE SHIELD | Attending: Neurosurgery

## 2015-03-10 DIAGNOSIS — M542 Cervicalgia: Secondary | ICD-10-CM | POA: Diagnosis present

## 2015-03-10 DIAGNOSIS — M25612 Stiffness of left shoulder, not elsewhere classified: Secondary | ICD-10-CM | POA: Diagnosis present

## 2015-03-10 DIAGNOSIS — M545 Low back pain: Secondary | ICD-10-CM | POA: Diagnosis present

## 2015-03-10 DIAGNOSIS — M25611 Stiffness of right shoulder, not elsewhere classified: Secondary | ICD-10-CM

## 2015-03-10 NOTE — Therapy (Signed)
[facility] [address] [place], [address] Phone: [phone]   Fax:  [phone]  Physical Therapy Treatment  Patient Details  Name: Daniel Morrison MRN: [id number] Date of Birth: 14-Mar-1971 Referring Provider: Earnie Larsson, MD  Encounter Date: 03/10/2015      PT End of Session - 03/10/15 1649    Visit Number 5   Number of Visits 17   Date for PT Re-Evaluation 56/86/16  per recert   PT Start Time 1649   PT Stop Time 1736   PT Time Calculation (min) 47 min   Activity Tolerance Patient tolerated treatment well   Behavior During Therapy [facility] for tasks assessed/performed      Past Medical History  Diagnosis Date  . GERD (gastroesophageal reflux disease)   . Back pain   . Diverticulitis   . Depression   . Anxiety   . Hyperlipidemia   . Seizures     AS CHILD AGE 21    Past Surgical History  Procedure Laterality Date  . Joint replacement    . Back surgery  05/2012  . Acl replacement  2004     right knee  . Upper gastrointestinal endoscopy    . Colonoscopy      There were no vitals filed for this visit.  Visit Diagnosis:  Cervicalgia  Shoulder stiffness, left  Low back pain, unspecified back pain laterality, with sciatica presence unspecified  Shoulder stiffness, right      Subjective Assessment - 03/10/15 1650    Subjective Not much neck pain currently. Back is doing good. Neck pain at most 2/10 for the past 5 days. Back pain 3/10 at most for the past 5 days. Pt also states getting injections for his back a couple of weeks ago which helped (steriod injection). Getting more injections for his back Novermber 22, 2016. Working on getting L rotator cuff surgery.  Pt also adds that his MD increased his Cymbalta dose to 90 mg.    Pertinent History Pt reports h/o chronic neck pain. 2 yrs ago pt reports he fx one cervical vertebra. He had pain prior to this, describes his pain as coming and going. He did  have chiropractic which helped, also has had injections which help for short periods of time. Pt reports pain going down R UE and in L shoulder. Pt has HA daily. Pt feels better with ibuprofen and percocet.  Pt is able to work despite pain, however after work he is unable to do anything because the pain "drains his energy". Pt also reports concentration difficulty.   Limitations Sitting;Lifting   How long can you sit comfortably? 30 min   Diagnostic tests Pt had an MRI 3 months ago which he reports indicated he had bone spurs.   Patient Stated Goals Pain relief and incr. energy at the end of the day. Back goal: to be able to sleep and go to the gym.    Currently in Pain? No/denies   Pain Onset More than a month ago   Multiple Pain Sites Yes  neck and back      Objectives: There-ex  Directed patient with prone on elbows with bilateral hip IR x 3 min, with deep breaths throughout,  Prone glute/quad set 10x10 seconds each LE for 2 sets,  Standing R shoulder extension with scapular retraction resisting red band 10x 5 seconds for 2 sets Ergonomic lifting a 17 lb box from floor 10x (pt demonstrates decreased soleus flexibility) Standing  soleus stretch 30 seconds x 3 each LE   Improved exercise technique, movement at target joints, use of target muscles after mod verbal, visual, tactile cues.    Manual therapy   Prone central P to A to T1, T2, T3, T4, T5 grade 3. Improved mobility    Improved thoracic mobility after manual therapy. No complain of neck or back pain during session. Demonstrates soleus tightness with ergonomic lifting (when squatting to pick up 17 lb box). Needed cues for proper back posture, and increasing knee flexion to use more LE and less of his back during ergonomic lifting.                            PT Education - 03/10/15 1701    Education provided Yes   Education Details ther-ex, HEP   Person(s) Educated Patient   Methods  Explanation;Demonstration;Tactile cues;Verbal cues;Handout   Comprehension Verbalized understanding;Returned demonstration             PT Long Term Goals - 02/22/15 1638    PT LONG TERM GOAL #1   Title Pt will report HA no greater than 2 per week.   Baseline Currently pt reports HA daily.   Time 4   Period Weeks   Status On-going   PT LONG TERM GOAL #2   Title Pt will have full ROM pain free in neck to allow for pain free driving.   Time 4   Period Weeks   Status Partially Met   PT LONG TERM GOAL #3   Title Pt will demonstrate full shoulder flexion.   Time 4   Period Weeks   Status On-going   PT LONG TERM GOAL #4   Title Patient will have a decrease in low back pain to 3/10 or less at worst to promote ability to sleep, and pick up items from the floor   Time 4   Period Weeks   Status New   PT LONG TERM GOAL #5   Title Patient will be able to pick up a 5 lbs weight from the floor at least 5 times with proper ergonomics with 2/10 or less back pain to promote ability to pick up items from the floor at home.   Time 4   Period Weeks   Status New               Plan - 03/10/15 1701    Clinical Impression Statement Improved thoracic mobility after manual therapy. No complain of neck or back pain during session. Demonstrates soleus tightness with ergonomic lifting (when squatting to pick up 17 lb box). Needed cues for proper back posture, and increasing knee flexion to use more LE and less of his back during ergonomic lifting.    Pt will benefit from skilled therapeutic intervention in order to improve on the following deficits Impaired flexibility;Improper body mechanics;Pain;Decreased range of motion;Decreased strength   Rehab Potential Good   PT Frequency 2x / week   PT Duration 4 weeks   PT Treatment/Interventions Manual techniques;Therapeutic exercise;Passive range of motion;Dry needling   Consulted and Agree with Plan of Care Patient        Problem  List Patient Active Problem List   Diagnosis Date Noted  . DIVERTICULITIS OF COLON 05/13/2007    Joneen Boers PT, DPT   03/10/2015, 5:42 PM  [place] [facility] [address], [place], [address] Phone: [phone]  Fax:  [phone]  Name: Daniel Morrison MRN: [id number] Date of Birth: 05-10-1970

## 2015-03-10 NOTE — Patient Instructions (Signed)
Calf / Soleus: Runners' Stretch II    One leg back and bent slightly, other forward and bent supporting weight, lean forward gently stretching calf of back leg. Hold __30__ seconds. Repeat with other leg. Repeat __3__ times. Do ___3_ sessions per day.  Copyright  VHI. All rights reserved.

## 2015-03-15 ENCOUNTER — Ambulatory Visit: Payer: BLUE CROSS/BLUE SHIELD

## 2015-03-15 DIAGNOSIS — M545 Low back pain: Secondary | ICD-10-CM

## 2015-03-15 DIAGNOSIS — M542 Cervicalgia: Secondary | ICD-10-CM

## 2015-03-15 DIAGNOSIS — M25612 Stiffness of left shoulder, not elsewhere classified: Secondary | ICD-10-CM

## 2015-03-15 DIAGNOSIS — M25611 Stiffness of right shoulder, not elsewhere classified: Secondary | ICD-10-CM

## 2015-03-15 NOTE — Patient Instructions (Addendum)
  Place 2 tennis balls in a sock. Lie down on your back placing the tennis balls at the base of your head. Keep legs straight (to help promote low back extension at the same time). 5 minutes daily.   You can then nod your head 10 times for 3 sets afterwards daily.     Pt was also recommended to try sitting with a lumbar towel roll as well as to perform standing gentle back extension movement with emphasis on leaning towards the R to help decrease disc related symptoms and to correct L lateral shift.   Pt demonstrated and verbalized understanding.

## 2015-03-15 NOTE — Therapy (Signed)
[facility] [place] [address] [place], [address] Phone: [phone]   Fax:  [phone]  Physical Therapy Treatment  Patient Details  Name: Daniel Morrison MRN: [id number] Date of Birth: 06-30-1970 Referring Provider: Earnie Larsson, MD  Encounter Date: 03/15/2015      PT End of Session - 03/15/15 1645    Visit Number 6   Number of Visits 17   Date for PT Re-Evaluation 17/49/44  per recert   PT Start Time 9675   PT Stop Time 1742   PT Time Calculation (min) 57 min   Activity Tolerance Patient tolerated treatment well   Behavior During Therapy [facility] for tasks assessed/performed      Past Medical History  Diagnosis Date  . GERD (gastroesophageal reflux disease)   . Back pain   . Diverticulitis   . Depression   . Anxiety   . Hyperlipidemia   . Seizures     AS CHILD AGE 8    Past Surgical History  Procedure Laterality Date  . Joint replacement    . Back surgery  05/2012  . Acl replacement  2004     right knee  . Upper gastrointestinal endoscopy    . Colonoscopy      There were no vitals filed for this visit.  Visit Diagnosis:  Cervicalgia  Shoulder stiffness, left  Low back pain, unspecified back pain laterality, with sciatica presence unspecified  Shoulder stiffness, right      Subjective Assessment - 03/15/15 1643    Subjective Had a rough night last night. 3-4/10 back pain currently, 3/10 neck pain currently. Headaches are not as bad. Headaches from the back of the head and neck occur every other day.  Some headaches currently due to sinuses   Pertinent History Pt reports h/o chronic neck pain. 2 yrs ago pt reports he fx one cervical vertebra. He had pain prior to this, describes his pain as coming and going. He did have chiropractic which helped, also has had injections which help for short periods of time. Pt reports pain going down R UE and in L shoulder. Pt has HA daily. Pt feels better with  ibuprofen and percocet.  Pt is able to work despite pain, however after work he is unable to do anything because the pain "drains his energy". Pt also reports concentration difficulty.   Limitations Sitting;Lifting   How long can you sit comfortably? 30 min   Diagnostic tests Pt had an MRI 3 months ago which he reports indicated he had bone spurs.   Patient Stated Goals Pain relief and incr. energy at the end of the day. Back goal: to be able to sleep and go to the gym.    Currently in Pain? Yes   Pain Onset More than a month ago    Pt also adds that today, he picked up something from the floor which bothered his back.   Posture: L lateral shift    Objectives:   There-ex  Manual L lateral shift correction by therapist (decreased back pain) Directed patient with standing L lateral shift correction 10x10 seconds (slight increase in low back burning sensation but centralized) Then 2x5 with 5 second holds with slight L trunk rotation (decreased low back symptoms) prone on elbows with bilateral hip IR x 2 min, with deep breaths throughout,  Supine with legs straight, suboccipital release resting occiput on 2 small balls x 3 min (decreased headache). Reviewed and given as part of his  HEP. Pt demonstrated and verbalized understanding.  Then with cervical nodding 10x3 Supine bridge 10x3 Standing back extension with slight bias to the R 10x2 Squats with emphasis on ergonomics tapping rear-end to  chair and pillow +Air Ex pad 10x, then with just pillow and chair 10x Pt education on headaches and greater occipital nerve, as well as soleus with ergonomics lifting with visuals using anatomy pictures.    Improved exercise technique, movement at target joints, use of target muscles after mod verbal, visual, tactile cues.     1/10 back pain. Pt states not really having neck pain or headaches after session. Difficulty with LE strength when trying to perform ergonomic squats while trying to maintain  neutral back posture, not bringing knees in front of toes, using hips and knees more than back.                         PT Education - 03/15/15 1756    Education provided Yes   Education Details ther-ex, HEP   Person(s) Educated Patient   Methods Explanation;Demonstration;Tactile cues;Verbal cues   Comprehension Verbalized understanding;Returned demonstration             PT Long Term Goals - 02/22/15 1638    PT LONG TERM GOAL #1   Title Pt will report HA no greater than 2 per week.   Baseline Currently pt reports HA daily.   Time 4   Period Weeks   Status On-going   PT LONG TERM GOAL #2   Title Pt will have full ROM pain free in neck to allow for pain free driving.   Time 4   Period Weeks   Status Partially Met   PT LONG TERM GOAL #3   Title Pt will demonstrate full shoulder flexion.   Time 4   Period Weeks   Status On-going   PT LONG TERM GOAL #4   Title Patient will have a decrease in low back pain to 3/10 or less at worst to promote ability to sleep, and pick up items from the floor   Time 4   Period Weeks   Status New   PT LONG TERM GOAL #5   Title Patient will be able to pick up a 5 lbs weight from the floor at least 5 times with proper ergonomics with 2/10 or less back pain to promote ability to pick up items from the floor at home.   Time 4   Period Weeks   Status New               Plan - 03/15/15 1644    Clinical Impression Statement 1/10 back pain. Pt states not really having neck pain or headaches after session. Difficulty with LE strength when trying to perform ergonomic squats while trying to maintain neutral back posture, not bringing knees in front of toes, using hips and knees more than back.   Pt will benefit from skilled therapeutic intervention in order to improve on the following deficits Impaired flexibility;Improper body mechanics;Pain;Decreased range of motion;Decreased strength   Rehab Potential Good   PT Frequency  2x / week   PT Duration 4 weeks   PT Treatment/Interventions Manual techniques;Therapeutic exercise;Passive range of motion;Dry needling   Consulted and Agree with Plan of Care Patient        Problem List Patient Active Problem List   Diagnosis Date Noted  . DIVERTICULITIS OF COLON 05/13/2007   Joneen Boers PT, DPT   03/15/2015, 6:03  PM  [place] [facility] [address] [place], [address] Phone: [phone]   Fax:  [phone]  Name: Daniel Morrison MRN: [id number] Date of Birth: June 30, 1970

## 2015-03-17 ENCOUNTER — Ambulatory Visit: Payer: BLUE CROSS/BLUE SHIELD

## 2015-03-22 ENCOUNTER — Ambulatory Visit: Payer: BLUE CROSS/BLUE SHIELD

## 2015-12-06 ENCOUNTER — Telehealth: Payer: Self-pay | Admitting: Gastroenterology

## 2015-12-06 NOTE — Telephone Encounter (Signed)
The pt was notified that we would not be able to send any prescriptions until he is seen in the office.  It has been 3 years since he was on the office.  He will call and try to set up a ppt with PCP until appt with Dr Christella Hartigan in October.

## 2016-02-21 ENCOUNTER — Ambulatory Visit (INDEPENDENT_AMBULATORY_CARE_PROVIDER_SITE_OTHER): Payer: BLUE CROSS/BLUE SHIELD | Admitting: Gastroenterology

## 2016-02-21 ENCOUNTER — Encounter: Payer: Self-pay | Admitting: Gastroenterology

## 2016-02-21 ENCOUNTER — Other Ambulatory Visit (INDEPENDENT_AMBULATORY_CARE_PROVIDER_SITE_OTHER): Payer: BLUE CROSS/BLUE SHIELD

## 2016-02-21 VITALS — BP 126/88 | HR 64 | Ht 69.0 in | Wt 255.4 lb

## 2016-02-21 DIAGNOSIS — K5900 Constipation, unspecified: Secondary | ICD-10-CM

## 2016-02-21 DIAGNOSIS — R109 Unspecified abdominal pain: Secondary | ICD-10-CM

## 2016-02-21 LAB — CBC WITH DIFFERENTIAL/PLATELET
BASOS ABS: 0 10*3/uL (ref 0.0–0.1)
Basophils Relative: 0.5 % (ref 0.0–3.0)
EOS ABS: 0.2 10*3/uL (ref 0.0–0.7)
Eosinophils Relative: 2.1 % (ref 0.0–5.0)
HCT: 40 % (ref 39.0–52.0)
Hemoglobin: 13.8 g/dL (ref 13.0–17.0)
LYMPHS PCT: 26 % (ref 12.0–46.0)
Lymphs Abs: 2 10*3/uL (ref 0.7–4.0)
MCHC: 34.5 g/dL (ref 30.0–36.0)
MCV: 89.6 fl (ref 78.0–100.0)
MONOS PCT: 9.2 % (ref 3.0–12.0)
Monocytes Absolute: 0.7 10*3/uL (ref 0.1–1.0)
NEUTROS ABS: 4.9 10*3/uL (ref 1.4–7.7)
Neutrophils Relative %: 62.2 % (ref 43.0–77.0)
PLATELETS: 249 10*3/uL (ref 150.0–400.0)
RBC: 4.46 Mil/uL (ref 4.22–5.81)
RDW: 13.2 % (ref 11.5–15.5)
WBC: 7.8 10*3/uL (ref 4.0–10.5)

## 2016-02-21 LAB — COMPREHENSIVE METABOLIC PANEL
ALK PHOS: 57 U/L (ref 39–117)
ALT: 18 U/L (ref 0–53)
AST: 15 U/L (ref 0–37)
Albumin: 4.4 g/dL (ref 3.5–5.2)
BILIRUBIN TOTAL: 0.4 mg/dL (ref 0.2–1.2)
BUN: 9 mg/dL (ref 6–23)
CALCIUM: 9.1 mg/dL (ref 8.4–10.5)
CO2: 29 mEq/L (ref 19–32)
Chloride: 99 mEq/L (ref 96–112)
Creatinine, Ser: 1.03 mg/dL (ref 0.40–1.50)
GFR: 82.4 mL/min (ref >60.00)
Glucose, Bld: 93 mg/dL (ref 70–99)
Potassium: 3.9 mEq/L (ref 3.5–5.1)
Sodium: 136 mEq/L (ref 135–145)
Total Protein: 7.2 g/dL (ref 6.0–8.3)

## 2016-02-21 MED ORDER — OMEPRAZOLE 40 MG PO CPDR: 40.0000 mg | DELAYED_RELEASE_CAPSULE | Freq: Every day | ORAL | 11 refills | Status: AC

## 2016-02-21 NOTE — Progress Notes (Signed)
Review of gastrointestinal problems:  1. Chronic constipation: colonoscopy January 2009 showing left side diverticulosis. No adenomas. Symptoms somewhat relieved with MiraLax daily.  2. Chronic abdominal discomfort and bloating, intermittent dysphagia. Status post EGD January 2009 showing a 3 cm hiatal hernia, reflux related esophagitis and a lower esophageal Schatzki's type ring dilated to 20 mm with a CRE balloon. Ultrasound January, 2011: normal gallbladder, no gallstones, ?hyperechoic liver. Labs in January, 2011: normal CBC, normal complete metabolic profile, normal thyroid testing, normal sedimentation rate. GES 2013 was essentially normal.  Summer 2014 continued abd discomforts, felt clinically to be related to mild gastroparesis from narcotics, trial of low dose reglan twice daily started.  CT 11/2012 for left sided abd pain was normal. Repeat EGD 01/2013 for persistent left sided abd pain: normal except for mild gastritis, H pylori negative;  3. routine risk for colon cancer, colonoscopy 05/2007 was normal except tics.  next colonoscopy January 2019  4. Mild acute diverticulitis: CT scan abdomen and pelvis with IV and oral contrast August 2013 showed mid descending colon inflammation with pericolonic stranding consistent with mild diverticulitis. He had a repeat CT scan about a week later showing improved inflammation. CBC around that time was normal. So was CMET. Treated with oral ABX well.    HPI: This is a   pleasant 45 year old man   whom I last saw about 3-1/2 years ago  Chief complaint is tightness in neck, dry throat, intermittent regurgitation, burning in abdomen  He has been having mild intermittent burning throughout his abdomen for several months.  He takes miralax PRN for constipation.  But says this causes diarrhea.  He's noticed longer periods between BMs.   In evenings he feels a lump in the throat or a tightness in neck, maybe a dry thoat during the day.  Feels like throat is  closing in, causing it to be hard to catch.  Kind of feels like maybe feels SOB.    No pyrosis.  He spits up, regurgitates food hours later after eating.  Certain foods are worse than others.  Feels like airway is compromised at night; feels like he's getting SOB  He's having minor solid food dysphagia at times. Gradually worsening.  Weight is up 27 pounds in 3 1/2 years since last visit (same scale here in GI office).  No overt gi bleeding.  Review of systems: Pertinent positive and negative review of systems were noted in the above HPI section. Complete review of systems was performed and was otherwise normal.   Past Medical History:  Diagnosis Date  . Anxiety   . Back pain   . Depression   . Diverticulitis   . GERD (gastroesophageal reflux disease)   . Hyperlipidemia   . Seizures (HCC)    AS CHILD AGE 5  . Sleep apnea    CPAP    Past Surgical History:  Procedure Laterality Date  . acl replacement  2004    right knee  . BACK SURGERY  05/2012  . COLONOSCOPY    . JOINT REPLACEMENT    . UPPER GASTROINTESTINAL ENDOSCOPY      Current Outpatient Prescriptions  Medication Sig Dispense Refill  . ALPRAZolam (XANAX) 0.25 MG tablet Take 0.25 mg by mouth daily as needed. For anxiety     . AMBULATORY NON FORMULARY MEDICATION CPAP at night    . buPROPion (WELLBUTRIN XL) 300 MG 24 hr tablet Take 300 mg by mouth daily.    . DULoxetine (CYMBALTA) 60 MG capsule Take 60 mg  by mouth daily.    . methocarbamol (ROBAXIN-750) 750 MG tablet Take 1 tablet (750 mg total) by mouth 4 (four) times daily. 60 tablet 0  . omeprazole (PRILOSEC) 20 MG capsule Take 20 mg by mouth daily.      . ondansetron (ZOFRAN) 4 MG tablet Take 1 tablet (4 mg total) by mouth every 12 (twelve) hours as needed for nausea. 30 tablet 4  . oxyCODONE-acetaminophen (PERCOCET/ROXICET) 5-325 MG per tablet Take 1-2 tablets by mouth every 4 (four) hours as needed for severe pain. 30 tablet 0  . Testosterone (ANDROGEL PUMP)  20.25 MG/ACT (1.62%) GEL Place 2 application onto the skin daily.     No current facility-administered medications for this visit.     Allergies as of 02/21/2016 - Review Complete 02/21/2016  Allergen Reaction Noted  . Ciprofloxacin  01/22/2012    Family History  Problem Relation Age of Onset  . Diabetes Brother     x2  . Diabetes Maternal Grandfather   . Stomach cancer Maternal Grandfather     Social History   Social History  . Marital status: Single    Spouse name: N/A  . Number of children: 0  . Years of education: N/A   Occupational History  . 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 Smoking status: Never Smoker  . Smokeless tobacco: Never Used  . Alcohol use 3.0 oz/week    5 Cans of beer per week  . Drug use: No  . Sexual activity: No   Other Topics Concern  . Not on file   Social History Narrative  . No narrative on file     Physical Exam: BP 126/88 (BP Location: Left Arm, Patient Position: Sitting, Cuff Size: Normal)   Pulse 64   Ht 5\' 9"  (1.753 m) Comment: height measured without shoes  Wt 255 lb 6 oz (115.8 kg)   BMI 37.71 kg/m  Constitutional: generally well-appearing Psychiatric: alert and oriented x3 Eyes: extraocular movements intact Mouth: oral pharynx moist, no lesions Neck: supple no lymphadenopathy Cardiovascular: heart regular rate and rhythm Lungs: clear to auscultation bilaterally Abdomen: soft, nontender, nondistended, no obvious ascites, no peritoneal signs, normal bowel sounds Extremities: no lower extremity edema bilaterally Skin: no lesions on visible extremities   Assessment and plan: 45 y.o. male with  Multiple complaints (burning in abdomen, tightness in neck, constipation)  He has many complaints but I'm most struck by the fact that he has gained 30 pounds the past 3 years. I suspect a lot of his issues are functional. On limited try him on increased proton pump inhibitor, 40 mg omeprazole  once daily. He is also going to use fiber supplements on a daily basis rather than intermittent MiraLAX. He'll get a basic set of labs today including a CBC, complete metabolic profile. He'll return to see me in 2 months to see how he responds respond to these conservative measures.   Rob Bunting, MD  Gastroenterology 02/21/2016, 4:08 PM  Cc: Wilburn Mylar, MD

## 2016-02-21 NOTE — Patient Instructions (Addendum)
You will have labs checked today in the basement lab.  Please head down after you check out with the front desk  (cbc, cmet). Increase to prescription strenght omeprazole 40mg  pill. Please start taking citrucel (orange flavored) powder fiber supplement.  This may cause some bloating at first but that usually goes away. Begin with a small spoonful and work your way up to a large, heaping spoonful daily over a week. Stay hydrated. Please return to see Dr. Christella HartiganJacobs in 2 months. Gradual weight loss.

## 2016-02-22 ENCOUNTER — Telehealth: Payer: Self-pay | Admitting: Gastroenterology

## 2016-02-22 NOTE — Telephone Encounter (Signed)
  Left message on machine to call back   Please call the patient. The labs were all normal. Should continue with the suggestions outlined at recent visit.

## 2016-02-23 NOTE — Telephone Encounter (Signed)
Left message on machine to call back  

## 2016-02-24 NOTE — Telephone Encounter (Signed)
Pt has been notified of the labs, pt requested a copy of the report to be mailed.   Copy mailed as requested.

## 2016-06-17 DIAGNOSIS — M531 Cervicobrachial syndrome: Secondary | ICD-10-CM | POA: Diagnosis not present

## 2016-06-17 DIAGNOSIS — M9901 Segmental and somatic dysfunction of cervical region: Secondary | ICD-10-CM | POA: Diagnosis not present

## 2016-06-17 DIAGNOSIS — M5137 Other intervertebral disc degeneration, lumbosacral region: Secondary | ICD-10-CM | POA: Diagnosis not present

## 2016-06-17 DIAGNOSIS — M5032 Other cervical disc degeneration, mid-cervical region, unspecified level: Secondary | ICD-10-CM | POA: Diagnosis not present

## 2016-06-20 DIAGNOSIS — I10 Essential (primary) hypertension: Secondary | ICD-10-CM | POA: Diagnosis not present

## 2016-06-20 DIAGNOSIS — M545 Low back pain: Secondary | ICD-10-CM | POA: Diagnosis not present

## 2016-06-20 DIAGNOSIS — M5416 Radiculopathy, lumbar region: Secondary | ICD-10-CM | POA: Diagnosis not present

## 2016-06-20 DIAGNOSIS — M4722 Other spondylosis with radiculopathy, cervical region: Secondary | ICD-10-CM | POA: Diagnosis not present

## 2016-06-22 DIAGNOSIS — F3342 Major depressive disorder, recurrent, in full remission: Secondary | ICD-10-CM | POA: Diagnosis not present

## 2016-06-22 DIAGNOSIS — E291 Testicular hypofunction: Secondary | ICD-10-CM | POA: Diagnosis not present

## 2016-06-22 DIAGNOSIS — E669 Obesity, unspecified: Secondary | ICD-10-CM | POA: Diagnosis not present

## 2016-06-22 DIAGNOSIS — Z6836 Body mass index (BMI) 36.0-36.9, adult: Secondary | ICD-10-CM | POA: Diagnosis not present

## 2016-06-26 DIAGNOSIS — E291 Testicular hypofunction: Secondary | ICD-10-CM | POA: Diagnosis not present

## 2016-06-28 DIAGNOSIS — G4733 Obstructive sleep apnea (adult) (pediatric): Secondary | ICD-10-CM | POA: Diagnosis not present

## 2016-07-01 DIAGNOSIS — M5032 Other cervical disc degeneration, mid-cervical region, unspecified level: Secondary | ICD-10-CM | POA: Diagnosis not present

## 2016-07-01 DIAGNOSIS — M5137 Other intervertebral disc degeneration, lumbosacral region: Secondary | ICD-10-CM | POA: Diagnosis not present

## 2016-07-01 DIAGNOSIS — M9901 Segmental and somatic dysfunction of cervical region: Secondary | ICD-10-CM | POA: Diagnosis not present

## 2016-07-01 DIAGNOSIS — M531 Cervicobrachial syndrome: Secondary | ICD-10-CM | POA: Diagnosis not present

## 2016-07-08 DIAGNOSIS — M5032 Other cervical disc degeneration, mid-cervical region, unspecified level: Secondary | ICD-10-CM | POA: Diagnosis not present

## 2016-07-08 DIAGNOSIS — M531 Cervicobrachial syndrome: Secondary | ICD-10-CM | POA: Diagnosis not present

## 2016-07-08 DIAGNOSIS — M5137 Other intervertebral disc degeneration, lumbosacral region: Secondary | ICD-10-CM | POA: Diagnosis not present

## 2016-07-08 DIAGNOSIS — M9901 Segmental and somatic dysfunction of cervical region: Secondary | ICD-10-CM | POA: Diagnosis not present

## 2016-07-13 DIAGNOSIS — E291 Testicular hypofunction: Secondary | ICD-10-CM | POA: Diagnosis not present

## 2016-07-15 DIAGNOSIS — M9901 Segmental and somatic dysfunction of cervical region: Secondary | ICD-10-CM | POA: Diagnosis not present

## 2016-07-15 DIAGNOSIS — M5137 Other intervertebral disc degeneration, lumbosacral region: Secondary | ICD-10-CM | POA: Diagnosis not present

## 2016-07-15 DIAGNOSIS — M5032 Other cervical disc degeneration, mid-cervical region, unspecified level: Secondary | ICD-10-CM | POA: Diagnosis not present

## 2016-07-15 DIAGNOSIS — M531 Cervicobrachial syndrome: Secondary | ICD-10-CM | POA: Diagnosis not present

## 2016-07-22 DIAGNOSIS — M5032 Other cervical disc degeneration, mid-cervical region, unspecified level: Secondary | ICD-10-CM | POA: Diagnosis not present

## 2016-07-22 DIAGNOSIS — M5137 Other intervertebral disc degeneration, lumbosacral region: Secondary | ICD-10-CM | POA: Diagnosis not present

## 2016-07-22 DIAGNOSIS — M9901 Segmental and somatic dysfunction of cervical region: Secondary | ICD-10-CM | POA: Diagnosis not present

## 2016-07-22 DIAGNOSIS — M531 Cervicobrachial syndrome: Secondary | ICD-10-CM | POA: Diagnosis not present

## 2016-07-29 DIAGNOSIS — M9901 Segmental and somatic dysfunction of cervical region: Secondary | ICD-10-CM | POA: Diagnosis not present

## 2016-07-29 DIAGNOSIS — M531 Cervicobrachial syndrome: Secondary | ICD-10-CM | POA: Diagnosis not present

## 2016-07-29 DIAGNOSIS — M5032 Other cervical disc degeneration, mid-cervical region, unspecified level: Secondary | ICD-10-CM | POA: Diagnosis not present

## 2016-07-29 DIAGNOSIS — M5137 Other intervertebral disc degeneration, lumbosacral region: Secondary | ICD-10-CM | POA: Diagnosis not present

## 2016-09-04 DIAGNOSIS — R7989 Other specified abnormal findings of blood chemistry: Secondary | ICD-10-CM | POA: Diagnosis not present

## 2016-09-04 DIAGNOSIS — E291 Testicular hypofunction: Secondary | ICD-10-CM | POA: Diagnosis not present

## 2016-09-12 DIAGNOSIS — M4722 Other spondylosis with radiculopathy, cervical region: Secondary | ICD-10-CM | POA: Diagnosis not present

## 2016-09-12 DIAGNOSIS — M545 Low back pain: Secondary | ICD-10-CM | POA: Diagnosis not present

## 2016-09-12 DIAGNOSIS — Z6835 Body mass index (BMI) 35.0-35.9, adult: Secondary | ICD-10-CM | POA: Diagnosis not present

## 2016-09-12 DIAGNOSIS — I10 Essential (primary) hypertension: Secondary | ICD-10-CM | POA: Diagnosis not present

## 2016-09-14 DIAGNOSIS — J312 Chronic pharyngitis: Secondary | ICD-10-CM | POA: Diagnosis not present

## 2016-09-14 DIAGNOSIS — R030 Elevated blood-pressure reading, without diagnosis of hypertension: Secondary | ICD-10-CM | POA: Diagnosis not present

## 2016-09-14 DIAGNOSIS — K219 Gastro-esophageal reflux disease without esophagitis: Secondary | ICD-10-CM | POA: Diagnosis not present

## 2016-10-06 DIAGNOSIS — H524 Presbyopia: Secondary | ICD-10-CM | POA: Diagnosis not present

## 2016-10-06 DIAGNOSIS — H5203 Hypermetropia, bilateral: Secondary | ICD-10-CM | POA: Diagnosis not present

## 2016-10-06 DIAGNOSIS — H47333 Pseudopapilledema of optic disc, bilateral: Secondary | ICD-10-CM | POA: Diagnosis not present

## 2016-10-06 DIAGNOSIS — H52223 Regular astigmatism, bilateral: Secondary | ICD-10-CM | POA: Diagnosis not present

## 2016-10-12 DIAGNOSIS — M47816 Spondylosis without myelopathy or radiculopathy, lumbar region: Secondary | ICD-10-CM | POA: Diagnosis not present

## 2016-12-12 DIAGNOSIS — M4722 Other spondylosis with radiculopathy, cervical region: Secondary | ICD-10-CM | POA: Diagnosis not present

## 2016-12-12 DIAGNOSIS — M47816 Spondylosis without myelopathy or radiculopathy, lumbar region: Secondary | ICD-10-CM | POA: Diagnosis not present

## 2016-12-12 DIAGNOSIS — M545 Low back pain: Secondary | ICD-10-CM | POA: Diagnosis not present

## 2016-12-12 DIAGNOSIS — R030 Elevated blood-pressure reading, without diagnosis of hypertension: Secondary | ICD-10-CM | POA: Diagnosis not present

## 2016-12-14 DIAGNOSIS — E291 Testicular hypofunction: Secondary | ICD-10-CM | POA: Diagnosis not present

## 2016-12-23 ENCOUNTER — Emergency Department (HOSPITAL_BASED_OUTPATIENT_CLINIC_OR_DEPARTMENT_OTHER): Payer: BLUE CROSS/BLUE SHIELD

## 2016-12-23 ENCOUNTER — Encounter (HOSPITAL_BASED_OUTPATIENT_CLINIC_OR_DEPARTMENT_OTHER): Payer: Self-pay | Admitting: Emergency Medicine

## 2016-12-23 ENCOUNTER — Emergency Department (HOSPITAL_BASED_OUTPATIENT_CLINIC_OR_DEPARTMENT_OTHER)
Admission: EM | Admit: 2016-12-23 | Discharge: 2016-12-23 | Disposition: A | Discharge status: Home / Self Care | Payer: BLUE CROSS/BLUE SHIELD | Attending: Emergency Medicine | Admitting: Emergency Medicine

## 2016-12-23 DIAGNOSIS — Y929 Unspecified place or not applicable: Secondary | ICD-10-CM | POA: Diagnosis not present

## 2016-12-23 DIAGNOSIS — S199XXA Unspecified injury of neck, initial encounter: Secondary | ICD-10-CM | POA: Diagnosis not present

## 2016-12-23 DIAGNOSIS — S161XXA Strain of muscle, fascia and tendon at neck level, initial encounter: Secondary | ICD-10-CM | POA: Diagnosis not present

## 2016-12-23 DIAGNOSIS — M542 Cervicalgia: Secondary | ICD-10-CM | POA: Diagnosis not present

## 2016-12-23 DIAGNOSIS — Y999 Unspecified external cause status: Secondary | ICD-10-CM | POA: Diagnosis not present

## 2016-12-23 DIAGNOSIS — Z79899 Other long term (current) drug therapy: Secondary | ICD-10-CM | POA: Insufficient documentation

## 2016-12-23 DIAGNOSIS — Y939 Activity, unspecified: Secondary | ICD-10-CM | POA: Insufficient documentation

## 2016-12-23 MED ORDER — CYCLOBENZAPRINE HCL 10 MG PO TABS: 10.0000 mg | ORAL_TABLET | Freq: Two times a day (BID) | ORAL | 0 refills | Status: AC | PRN

## 2016-12-23 MED ORDER — IBUPROFEN 800 MG PO TABS
800.0000 mg | ORAL_TABLET | Freq: Once | ORAL | Status: AC
Start: 2016-12-23 — End: 2016-12-23
  Administered 2016-12-23: 800 mg via ORAL
  Filled 2016-12-23: qty 1

## 2016-12-23 MED ORDER — IBUPROFEN 600 MG PO TABS: 600.0000 mg | ORAL_TABLET | Freq: Four times a day (QID) | ORAL | 0 refills | Status: AC | PRN
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
## 2016-12-23 NOTE — ED Notes (Signed)
Patient transported to X-ray 
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
## 2017-01-15 DIAGNOSIS — M47816 Spondylosis without myelopathy or radiculopathy, lumbar region: Secondary | ICD-10-CM | POA: Diagnosis not present

## 2017-01-31 DIAGNOSIS — E291 Testicular hypofunction: Secondary | ICD-10-CM | POA: Diagnosis not present

## 2017-01-31 DIAGNOSIS — E782 Mixed hyperlipidemia: Secondary | ICD-10-CM | POA: Diagnosis not present

## 2017-02-02 DIAGNOSIS — I10 Essential (primary) hypertension: Secondary | ICD-10-CM | POA: Diagnosis not present

## 2017-02-02 DIAGNOSIS — B351 Tinea unguium: Secondary | ICD-10-CM | POA: Diagnosis not present

## 2017-02-02 DIAGNOSIS — Z23 Encounter for immunization: Secondary | ICD-10-CM | POA: Diagnosis not present

## 2017-02-02 DIAGNOSIS — Z0000 Encounter for general adult medical examination without abnormal findings: Secondary | ICD-10-CM | POA: Diagnosis not present

## 2017-02-02 DIAGNOSIS — F411 Generalized anxiety disorder: Secondary | ICD-10-CM | POA: Diagnosis not present

## 2017-02-02 DIAGNOSIS — E669 Obesity, unspecified: Secondary | ICD-10-CM | POA: Diagnosis not present

## 2017-02-02 DIAGNOSIS — F3342 Major depressive disorder, recurrent, in full remission: Secondary | ICD-10-CM | POA: Diagnosis not present

## 2017-02-07 DIAGNOSIS — M9904 Segmental and somatic dysfunction of sacral region: Secondary | ICD-10-CM | POA: Diagnosis not present

## 2017-02-07 DIAGNOSIS — M5416 Radiculopathy, lumbar region: Secondary | ICD-10-CM | POA: Diagnosis not present

## 2017-02-07 DIAGNOSIS — M9902 Segmental and somatic dysfunction of thoracic region: Secondary | ICD-10-CM | POA: Diagnosis not present

## 2017-02-07 DIAGNOSIS — M9903 Segmental and somatic dysfunction of lumbar region: Secondary | ICD-10-CM | POA: Diagnosis not present

## 2017-02-10 DIAGNOSIS — M9902 Segmental and somatic dysfunction of thoracic region: Secondary | ICD-10-CM | POA: Diagnosis not present

## 2017-02-10 DIAGNOSIS — M9903 Segmental and somatic dysfunction of lumbar region: Secondary | ICD-10-CM | POA: Diagnosis not present

## 2017-02-10 DIAGNOSIS — M9904 Segmental and somatic dysfunction of sacral region: Secondary | ICD-10-CM | POA: Diagnosis not present

## 2017-02-10 DIAGNOSIS — M5416 Radiculopathy, lumbar region: Secondary | ICD-10-CM | POA: Diagnosis not present

## 2017-02-14 DIAGNOSIS — M9903 Segmental and somatic dysfunction of lumbar region: Secondary | ICD-10-CM | POA: Diagnosis not present

## 2017-02-14 DIAGNOSIS — E291 Testicular hypofunction: Secondary | ICD-10-CM | POA: Diagnosis not present

## 2017-02-14 DIAGNOSIS — M9904 Segmental and somatic dysfunction of sacral region: Secondary | ICD-10-CM | POA: Diagnosis not present

## 2017-02-14 DIAGNOSIS — M9902 Segmental and somatic dysfunction of thoracic region: Secondary | ICD-10-CM | POA: Diagnosis not present

## 2017-02-14 DIAGNOSIS — M5416 Radiculopathy, lumbar region: Secondary | ICD-10-CM | POA: Diagnosis not present

## 2017-02-28 DIAGNOSIS — M47816 Spondylosis without myelopathy or radiculopathy, lumbar region: Secondary | ICD-10-CM | POA: Diagnosis not present

## 2017-02-28 DIAGNOSIS — I10 Essential (primary) hypertension: Secondary | ICD-10-CM | POA: Diagnosis not present

## 2017-02-28 DIAGNOSIS — M4722 Other spondylosis with radiculopathy, cervical region: Secondary | ICD-10-CM | POA: Diagnosis not present

## 2017-02-28 DIAGNOSIS — M545 Low back pain: Secondary | ICD-10-CM | POA: Diagnosis not present

## 2017-03-17 DIAGNOSIS — M9901 Segmental and somatic dysfunction of cervical region: Secondary | ICD-10-CM | POA: Diagnosis not present

## 2017-03-17 DIAGNOSIS — M5032 Other cervical disc degeneration, mid-cervical region, unspecified level: Secondary | ICD-10-CM | POA: Diagnosis not present

## 2017-03-17 DIAGNOSIS — M5137 Other intervertebral disc degeneration, lumbosacral region: Secondary | ICD-10-CM | POA: Diagnosis not present

## 2017-03-17 DIAGNOSIS — M531 Cervicobrachial syndrome: Secondary | ICD-10-CM | POA: Diagnosis not present

## 2017-03-22 DIAGNOSIS — L739 Follicular disorder, unspecified: Secondary | ICD-10-CM | POA: Diagnosis not present

## 2017-04-07 DIAGNOSIS — M5032 Other cervical disc degeneration, mid-cervical region, unspecified level: Secondary | ICD-10-CM | POA: Diagnosis not present

## 2017-04-07 DIAGNOSIS — M9901 Segmental and somatic dysfunction of cervical region: Secondary | ICD-10-CM | POA: Diagnosis not present

## 2017-04-07 DIAGNOSIS — M531 Cervicobrachial syndrome: Secondary | ICD-10-CM | POA: Diagnosis not present

## 2017-04-07 DIAGNOSIS — M5137 Other intervertebral disc degeneration, lumbosacral region: Secondary | ICD-10-CM | POA: Diagnosis not present

## 2017-04-18 DIAGNOSIS — I10 Essential (primary) hypertension: Secondary | ICD-10-CM | POA: Diagnosis not present

## 2017-04-18 DIAGNOSIS — B078 Other viral warts: Secondary | ICD-10-CM | POA: Diagnosis not present

## 2017-04-18 DIAGNOSIS — F3342 Major depressive disorder, recurrent, in full remission: Secondary | ICD-10-CM | POA: Diagnosis not present

## 2017-04-18 DIAGNOSIS — D485 Neoplasm of uncertain behavior of skin: Secondary | ICD-10-CM | POA: Diagnosis not present

## 2017-04-18 DIAGNOSIS — F411 Generalized anxiety disorder: Secondary | ICD-10-CM | POA: Diagnosis not present

## 2017-04-18 DIAGNOSIS — E291 Testicular hypofunction: Secondary | ICD-10-CM | POA: Diagnosis not present

## 2017-04-18 DIAGNOSIS — E669 Obesity, unspecified: Secondary | ICD-10-CM | POA: Diagnosis not present

## 2017-04-21 DIAGNOSIS — M9901 Segmental and somatic dysfunction of cervical region: Secondary | ICD-10-CM | POA: Diagnosis not present

## 2017-04-21 DIAGNOSIS — M5032 Other cervical disc degeneration, mid-cervical region, unspecified level: Secondary | ICD-10-CM | POA: Diagnosis not present

## 2017-04-21 DIAGNOSIS — M5137 Other intervertebral disc degeneration, lumbosacral region: Secondary | ICD-10-CM | POA: Diagnosis not present

## 2017-04-21 DIAGNOSIS — M531 Cervicobrachial syndrome: Secondary | ICD-10-CM | POA: Diagnosis not present

## 2017-05-05 DIAGNOSIS — M5032 Other cervical disc degeneration, mid-cervical region, unspecified level: Secondary | ICD-10-CM | POA: Diagnosis not present

## 2017-05-05 DIAGNOSIS — M5137 Other intervertebral disc degeneration, lumbosacral region: Secondary | ICD-10-CM | POA: Diagnosis not present

## 2017-05-05 DIAGNOSIS — M531 Cervicobrachial syndrome: Secondary | ICD-10-CM | POA: Diagnosis not present

## 2017-05-05 DIAGNOSIS — M9901 Segmental and somatic dysfunction of cervical region: Secondary | ICD-10-CM | POA: Diagnosis not present

## 2017-05-19 DIAGNOSIS — M531 Cervicobrachial syndrome: Secondary | ICD-10-CM | POA: Diagnosis not present

## 2017-05-19 DIAGNOSIS — M5137 Other intervertebral disc degeneration, lumbosacral region: Secondary | ICD-10-CM | POA: Diagnosis not present

## 2017-05-19 DIAGNOSIS — M5032 Other cervical disc degeneration, mid-cervical region, unspecified level: Secondary | ICD-10-CM | POA: Diagnosis not present

## 2017-05-19 DIAGNOSIS — M9901 Segmental and somatic dysfunction of cervical region: Secondary | ICD-10-CM | POA: Diagnosis not present

## 2017-05-29 DIAGNOSIS — M47816 Spondylosis without myelopathy or radiculopathy, lumbar region: Secondary | ICD-10-CM | POA: Diagnosis not present

## 2017-05-29 DIAGNOSIS — I10 Essential (primary) hypertension: Secondary | ICD-10-CM | POA: Diagnosis not present

## 2017-05-29 DIAGNOSIS — M545 Low back pain: Secondary | ICD-10-CM | POA: Diagnosis not present

## 2017-05-29 DIAGNOSIS — Z6835 Body mass index (BMI) 35.0-35.9, adult: Secondary | ICD-10-CM | POA: Diagnosis not present

## 2017-06-02 DIAGNOSIS — M5032 Other cervical disc degeneration, mid-cervical region, unspecified level: Secondary | ICD-10-CM | POA: Diagnosis not present

## 2017-06-02 DIAGNOSIS — M531 Cervicobrachial syndrome: Secondary | ICD-10-CM | POA: Diagnosis not present

## 2017-06-02 DIAGNOSIS — M9901 Segmental and somatic dysfunction of cervical region: Secondary | ICD-10-CM | POA: Diagnosis not present

## 2017-06-02 DIAGNOSIS — M5137 Other intervertebral disc degeneration, lumbosacral region: Secondary | ICD-10-CM | POA: Diagnosis not present

## 2017-06-12 DIAGNOSIS — M25511 Pain in right shoulder: Secondary | ICD-10-CM | POA: Diagnosis not present

## 2017-06-12 DIAGNOSIS — M19011 Primary osteoarthritis, right shoulder: Secondary | ICD-10-CM | POA: Diagnosis not present

## 2017-06-12 DIAGNOSIS — Z881 Allergy status to other antibiotic agents status: Secondary | ICD-10-CM | POA: Diagnosis not present

## 2017-06-12 DIAGNOSIS — M75121 Complete rotator cuff tear or rupture of right shoulder, not specified as traumatic: Secondary | ICD-10-CM | POA: Diagnosis not present

## 2017-06-12 DIAGNOSIS — G8929 Other chronic pain: Secondary | ICD-10-CM | POA: Diagnosis not present

## 2017-06-13 DIAGNOSIS — G4733 Obstructive sleep apnea (adult) (pediatric): Secondary | ICD-10-CM | POA: Diagnosis not present

## 2017-06-20 DIAGNOSIS — S46011A Strain of muscle(s) and tendon(s) of the rotator cuff of right shoulder, initial encounter: Secondary | ICD-10-CM | POA: Diagnosis not present

## 2017-06-26 DIAGNOSIS — M75121 Complete rotator cuff tear or rupture of right shoulder, not specified as traumatic: Secondary | ICD-10-CM | POA: Diagnosis not present

## 2017-07-06 DIAGNOSIS — M7541 Impingement syndrome of right shoulder: Secondary | ICD-10-CM | POA: Diagnosis not present

## 2017-07-06 DIAGNOSIS — E669 Obesity, unspecified: Secondary | ICD-10-CM | POA: Diagnosis not present

## 2017-07-06 DIAGNOSIS — G4733 Obstructive sleep apnea (adult) (pediatric): Secondary | ICD-10-CM | POA: Diagnosis not present

## 2017-07-06 DIAGNOSIS — Z881 Allergy status to other antibiotic agents status: Secondary | ICD-10-CM | POA: Diagnosis not present

## 2017-07-06 DIAGNOSIS — M75101 Unspecified rotator cuff tear or rupture of right shoulder, not specified as traumatic: Secondary | ICD-10-CM | POA: Diagnosis not present

## 2017-07-06 DIAGNOSIS — X58XXXA Exposure to other specified factors, initial encounter: Secondary | ICD-10-CM | POA: Diagnosis not present

## 2017-07-06 DIAGNOSIS — Z79899 Other long term (current) drug therapy: Secondary | ICD-10-CM | POA: Diagnosis not present

## 2017-07-06 DIAGNOSIS — M25811 Other specified joint disorders, right shoulder: Secondary | ICD-10-CM | POA: Diagnosis not present

## 2017-07-06 DIAGNOSIS — G8918 Other acute postprocedural pain: Secondary | ICD-10-CM | POA: Diagnosis not present

## 2017-07-06 DIAGNOSIS — M6289 Other specified disorders of muscle: Secondary | ICD-10-CM | POA: Diagnosis not present

## 2017-07-06 DIAGNOSIS — M75121 Complete rotator cuff tear or rupture of right shoulder, not specified as traumatic: Secondary | ICD-10-CM | POA: Diagnosis not present

## 2017-07-06 DIAGNOSIS — Z6834 Body mass index (BMI) 34.0-34.9, adult: Secondary | ICD-10-CM | POA: Diagnosis not present

## 2017-07-06 DIAGNOSIS — I10 Essential (primary) hypertension: Secondary | ICD-10-CM | POA: Diagnosis not present

## 2017-07-06 DIAGNOSIS — K219 Gastro-esophageal reflux disease without esophagitis: Secondary | ICD-10-CM | POA: Diagnosis not present

## 2017-07-06 DIAGNOSIS — Z87891 Personal history of nicotine dependence: Secondary | ICD-10-CM | POA: Diagnosis not present

## 2017-07-06 DIAGNOSIS — E782 Mixed hyperlipidemia: Secondary | ICD-10-CM | POA: Diagnosis not present

## 2017-07-06 DIAGNOSIS — Z9989 Dependence on other enabling machines and devices: Secondary | ICD-10-CM | POA: Diagnosis not present

## 2017-07-06 DIAGNOSIS — S46111A Strain of muscle, fascia and tendon of long head of biceps, right arm, initial encounter: Secondary | ICD-10-CM | POA: Diagnosis not present

## 2017-07-17 DIAGNOSIS — E669 Obesity, unspecified: Secondary | ICD-10-CM | POA: Diagnosis not present

## 2017-07-17 DIAGNOSIS — I10 Essential (primary) hypertension: Secondary | ICD-10-CM | POA: Diagnosis not present

## 2017-07-17 DIAGNOSIS — F411 Generalized anxiety disorder: Secondary | ICD-10-CM | POA: Diagnosis not present

## 2017-07-17 DIAGNOSIS — F3342 Major depressive disorder, recurrent, in full remission: Secondary | ICD-10-CM | POA: Diagnosis not present

## 2017-07-18 DIAGNOSIS — M75121 Complete rotator cuff tear or rupture of right shoulder, not specified as traumatic: Secondary | ICD-10-CM | POA: Diagnosis not present

## 2017-07-18 DIAGNOSIS — M25511 Pain in right shoulder: Secondary | ICD-10-CM | POA: Diagnosis not present

## 2017-07-18 DIAGNOSIS — M25611 Stiffness of right shoulder, not elsewhere classified: Secondary | ICD-10-CM | POA: Diagnosis not present

## 2017-07-18 DIAGNOSIS — M6281 Muscle weakness (generalized): Secondary | ICD-10-CM | POA: Diagnosis not present

## 2017-07-19 DIAGNOSIS — E291 Testicular hypofunction: Secondary | ICD-10-CM | POA: Diagnosis not present

## 2017-07-19 DIAGNOSIS — I10 Essential (primary) hypertension: Secondary | ICD-10-CM | POA: Diagnosis not present

## 2017-07-24 DIAGNOSIS — M25611 Stiffness of right shoulder, not elsewhere classified: Secondary | ICD-10-CM | POA: Diagnosis not present

## 2017-07-24 DIAGNOSIS — M25511 Pain in right shoulder: Secondary | ICD-10-CM | POA: Diagnosis not present

## 2017-07-24 DIAGNOSIS — M75121 Complete rotator cuff tear or rupture of right shoulder, not specified as traumatic: Secondary | ICD-10-CM | POA: Diagnosis not present

## 2017-07-24 DIAGNOSIS — M6281 Muscle weakness (generalized): Secondary | ICD-10-CM | POA: Diagnosis not present

## 2017-07-26 DIAGNOSIS — M75121 Complete rotator cuff tear or rupture of right shoulder, not specified as traumatic: Secondary | ICD-10-CM | POA: Diagnosis not present

## 2017-07-26 DIAGNOSIS — M25511 Pain in right shoulder: Secondary | ICD-10-CM | POA: Diagnosis not present

## 2017-07-26 DIAGNOSIS — M25611 Stiffness of right shoulder, not elsewhere classified: Secondary | ICD-10-CM | POA: Diagnosis not present

## 2017-07-26 DIAGNOSIS — M6281 Muscle weakness (generalized): Secondary | ICD-10-CM | POA: Diagnosis not present

## 2017-07-31 DIAGNOSIS — M75121 Complete rotator cuff tear or rupture of right shoulder, not specified as traumatic: Secondary | ICD-10-CM | POA: Diagnosis not present

## 2017-07-31 DIAGNOSIS — M25511 Pain in right shoulder: Secondary | ICD-10-CM | POA: Diagnosis not present

## 2017-07-31 DIAGNOSIS — M6281 Muscle weakness (generalized): Secondary | ICD-10-CM | POA: Diagnosis not present

## 2017-07-31 DIAGNOSIS — M25611 Stiffness of right shoulder, not elsewhere classified: Secondary | ICD-10-CM | POA: Diagnosis not present

## 2017-08-02 DIAGNOSIS — M75121 Complete rotator cuff tear or rupture of right shoulder, not specified as traumatic: Secondary | ICD-10-CM | POA: Diagnosis not present

## 2017-08-02 DIAGNOSIS — M25611 Stiffness of right shoulder, not elsewhere classified: Secondary | ICD-10-CM | POA: Diagnosis not present

## 2017-08-02 DIAGNOSIS — M25511 Pain in right shoulder: Secondary | ICD-10-CM | POA: Diagnosis not present

## 2017-08-02 DIAGNOSIS — M6281 Muscle weakness (generalized): Secondary | ICD-10-CM | POA: Diagnosis not present

## 2017-08-07 DIAGNOSIS — M75121 Complete rotator cuff tear or rupture of right shoulder, not specified as traumatic: Secondary | ICD-10-CM | POA: Diagnosis not present

## 2017-08-07 DIAGNOSIS — M25511 Pain in right shoulder: Secondary | ICD-10-CM | POA: Diagnosis not present

## 2017-08-07 DIAGNOSIS — M25611 Stiffness of right shoulder, not elsewhere classified: Secondary | ICD-10-CM | POA: Diagnosis not present

## 2017-08-07 DIAGNOSIS — M6281 Muscle weakness (generalized): Secondary | ICD-10-CM | POA: Diagnosis not present

## 2017-08-14 DIAGNOSIS — M6281 Muscle weakness (generalized): Secondary | ICD-10-CM | POA: Diagnosis not present

## 2017-08-14 DIAGNOSIS — M47816 Spondylosis without myelopathy or radiculopathy, lumbar region: Secondary | ICD-10-CM | POA: Diagnosis not present

## 2017-08-14 DIAGNOSIS — M545 Low back pain: Secondary | ICD-10-CM | POA: Diagnosis not present

## 2017-08-14 DIAGNOSIS — Z6835 Body mass index (BMI) 35.0-35.9, adult: Secondary | ICD-10-CM | POA: Diagnosis not present

## 2017-08-14 DIAGNOSIS — M75121 Complete rotator cuff tear or rupture of right shoulder, not specified as traumatic: Secondary | ICD-10-CM | POA: Diagnosis not present

## 2017-08-14 DIAGNOSIS — M25511 Pain in right shoulder: Secondary | ICD-10-CM | POA: Diagnosis not present

## 2017-08-14 DIAGNOSIS — M25611 Stiffness of right shoulder, not elsewhere classified: Secondary | ICD-10-CM | POA: Diagnosis not present

## 2017-08-15 DIAGNOSIS — J029 Acute pharyngitis, unspecified: Secondary | ICD-10-CM | POA: Diagnosis not present

## 2017-08-15 DIAGNOSIS — J0100 Acute maxillary sinusitis, unspecified: Secondary | ICD-10-CM | POA: Diagnosis not present

## 2017-08-21 DIAGNOSIS — M75121 Complete rotator cuff tear or rupture of right shoulder, not specified as traumatic: Secondary | ICD-10-CM | POA: Diagnosis not present

## 2017-08-21 DIAGNOSIS — M6281 Muscle weakness (generalized): Secondary | ICD-10-CM | POA: Diagnosis not present

## 2017-08-21 DIAGNOSIS — M25511 Pain in right shoulder: Secondary | ICD-10-CM | POA: Diagnosis not present

## 2017-08-21 DIAGNOSIS — M25611 Stiffness of right shoulder, not elsewhere classified: Secondary | ICD-10-CM | POA: Diagnosis not present

## 2017-08-23 DIAGNOSIS — M25611 Stiffness of right shoulder, not elsewhere classified: Secondary | ICD-10-CM | POA: Diagnosis not present

## 2017-08-23 DIAGNOSIS — M6281 Muscle weakness (generalized): Secondary | ICD-10-CM | POA: Diagnosis not present

## 2017-08-23 DIAGNOSIS — M25511 Pain in right shoulder: Secondary | ICD-10-CM | POA: Diagnosis not present

## 2017-08-23 DIAGNOSIS — M75121 Complete rotator cuff tear or rupture of right shoulder, not specified as traumatic: Secondary | ICD-10-CM | POA: Diagnosis not present

## 2017-09-04 DIAGNOSIS — M6281 Muscle weakness (generalized): Secondary | ICD-10-CM | POA: Diagnosis not present

## 2017-09-04 DIAGNOSIS — M75121 Complete rotator cuff tear or rupture of right shoulder, not specified as traumatic: Secondary | ICD-10-CM | POA: Diagnosis not present

## 2017-09-04 DIAGNOSIS — M25611 Stiffness of right shoulder, not elsewhere classified: Secondary | ICD-10-CM | POA: Diagnosis not present

## 2017-09-04 DIAGNOSIS — M25511 Pain in right shoulder: Secondary | ICD-10-CM | POA: Diagnosis not present

## 2017-09-20 DIAGNOSIS — M25511 Pain in right shoulder: Secondary | ICD-10-CM | POA: Diagnosis not present

## 2017-09-20 DIAGNOSIS — M6281 Muscle weakness (generalized): Secondary | ICD-10-CM | POA: Diagnosis not present

## 2017-09-20 DIAGNOSIS — M25611 Stiffness of right shoulder, not elsewhere classified: Secondary | ICD-10-CM | POA: Diagnosis not present

## 2017-09-20 DIAGNOSIS — M75121 Complete rotator cuff tear or rupture of right shoulder, not specified as traumatic: Secondary | ICD-10-CM | POA: Diagnosis not present

## 2017-09-25 DIAGNOSIS — M75121 Complete rotator cuff tear or rupture of right shoulder, not specified as traumatic: Secondary | ICD-10-CM | POA: Diagnosis not present

## 2017-09-25 DIAGNOSIS — M25511 Pain in right shoulder: Secondary | ICD-10-CM | POA: Diagnosis not present

## 2017-09-25 DIAGNOSIS — M6281 Muscle weakness (generalized): Secondary | ICD-10-CM | POA: Diagnosis not present

## 2017-09-25 DIAGNOSIS — M25611 Stiffness of right shoulder, not elsewhere classified: Secondary | ICD-10-CM | POA: Diagnosis not present

## 2017-10-03 DIAGNOSIS — M6281 Muscle weakness (generalized): Secondary | ICD-10-CM | POA: Diagnosis not present

## 2017-10-03 DIAGNOSIS — M75121 Complete rotator cuff tear or rupture of right shoulder, not specified as traumatic: Secondary | ICD-10-CM | POA: Diagnosis not present

## 2017-10-03 DIAGNOSIS — M25611 Stiffness of right shoulder, not elsewhere classified: Secondary | ICD-10-CM | POA: Diagnosis not present

## 2017-10-03 DIAGNOSIS — M25511 Pain in right shoulder: Secondary | ICD-10-CM | POA: Diagnosis not present

## 2017-11-05 DIAGNOSIS — E291 Testicular hypofunction: Secondary | ICD-10-CM | POA: Diagnosis not present

## 2017-11-06 DIAGNOSIS — E291 Testicular hypofunction: Secondary | ICD-10-CM | POA: Diagnosis not present

## 2017-11-15 DIAGNOSIS — E785 Hyperlipidemia, unspecified: Secondary | ICD-10-CM | POA: Diagnosis not present

## 2017-11-15 DIAGNOSIS — I10 Essential (primary) hypertension: Secondary | ICD-10-CM | POA: Diagnosis not present

## 2017-11-15 DIAGNOSIS — E669 Obesity, unspecified: Secondary | ICD-10-CM | POA: Diagnosis not present

## 2017-11-16 DIAGNOSIS — R002 Palpitations: Secondary | ICD-10-CM | POA: Diagnosis not present

## 2017-11-16 DIAGNOSIS — E785 Hyperlipidemia, unspecified: Secondary | ICD-10-CM | POA: Diagnosis not present

## 2017-11-16 DIAGNOSIS — I10 Essential (primary) hypertension: Secondary | ICD-10-CM | POA: Diagnosis not present

## 2017-11-19 DIAGNOSIS — M545 Low back pain: Secondary | ICD-10-CM | POA: Diagnosis not present

## 2017-11-19 DIAGNOSIS — M47816 Spondylosis without myelopathy or radiculopathy, lumbar region: Secondary | ICD-10-CM | POA: Diagnosis not present

## 2017-11-19 DIAGNOSIS — M5412 Radiculopathy, cervical region: Secondary | ICD-10-CM | POA: Diagnosis not present

## 2017-11-19 DIAGNOSIS — M4722 Other spondylosis with radiculopathy, cervical region: Secondary | ICD-10-CM | POA: Diagnosis not present

## 2017-11-20 DIAGNOSIS — M75121 Complete rotator cuff tear or rupture of right shoulder, not specified as traumatic: Secondary | ICD-10-CM | POA: Diagnosis not present

## 2017-11-30 DIAGNOSIS — R002 Palpitations: Secondary | ICD-10-CM | POA: Diagnosis not present

## 2017-12-13 DIAGNOSIS — R0602 Shortness of breath: Secondary | ICD-10-CM | POA: Diagnosis not present

## 2017-12-13 DIAGNOSIS — G4733 Obstructive sleep apnea (adult) (pediatric): Secondary | ICD-10-CM | POA: Diagnosis not present

## 2017-12-13 DIAGNOSIS — I499 Cardiac arrhythmia, unspecified: Secondary | ICD-10-CM | POA: Diagnosis not present

## 2017-12-13 DIAGNOSIS — Z9989 Dependence on other enabling machines and devices: Secondary | ICD-10-CM | POA: Diagnosis not present

## 2017-12-13 DIAGNOSIS — R0600 Dyspnea, unspecified: Secondary | ICD-10-CM | POA: Diagnosis not present

## 2017-12-27 DIAGNOSIS — M75121 Complete rotator cuff tear or rupture of right shoulder, not specified as traumatic: Secondary | ICD-10-CM | POA: Diagnosis not present

## 2017-12-27 DIAGNOSIS — Z9889 Other specified postprocedural states: Secondary | ICD-10-CM | POA: Diagnosis not present

## 2017-12-31 DIAGNOSIS — R0602 Shortness of breath: Secondary | ICD-10-CM | POA: Diagnosis not present

## 2017-12-31 DIAGNOSIS — R0600 Dyspnea, unspecified: Secondary | ICD-10-CM | POA: Diagnosis not present

## 2018-01-01 DIAGNOSIS — G4733 Obstructive sleep apnea (adult) (pediatric): Secondary | ICD-10-CM | POA: Diagnosis not present

## 2018-01-23 DIAGNOSIS — E291 Testicular hypofunction: Secondary | ICD-10-CM | POA: Diagnosis not present

## 2018-01-31 DIAGNOSIS — I10 Essential (primary) hypertension: Secondary | ICD-10-CM | POA: Diagnosis not present

## 2018-02-05 DIAGNOSIS — R300 Dysuria: Secondary | ICD-10-CM | POA: Diagnosis not present

## 2018-02-05 DIAGNOSIS — Z23 Encounter for immunization: Secondary | ICD-10-CM | POA: Diagnosis not present

## 2018-02-05 DIAGNOSIS — Z0000 Encounter for general adult medical examination without abnormal findings: Secondary | ICD-10-CM | POA: Diagnosis not present

## 2018-02-05 DIAGNOSIS — M19041 Primary osteoarthritis, right hand: Secondary | ICD-10-CM | POA: Diagnosis not present

## 2018-02-05 DIAGNOSIS — R002 Palpitations: Secondary | ICD-10-CM | POA: Diagnosis not present

## 2018-02-08 DIAGNOSIS — E291 Testicular hypofunction: Secondary | ICD-10-CM | POA: Diagnosis not present

## 2018-02-14 DIAGNOSIS — I10 Essential (primary) hypertension: Secondary | ICD-10-CM | POA: Diagnosis not present

## 2018-02-14 DIAGNOSIS — M47816 Spondylosis without myelopathy or radiculopathy, lumbar region: Secondary | ICD-10-CM | POA: Diagnosis not present

## 2018-02-14 DIAGNOSIS — M4722 Other spondylosis with radiculopathy, cervical region: Secondary | ICD-10-CM | POA: Diagnosis not present

## 2018-02-14 DIAGNOSIS — M5412 Radiculopathy, cervical region: Secondary | ICD-10-CM | POA: Diagnosis not present

## 2018-03-06 DIAGNOSIS — E291 Testicular hypofunction: Secondary | ICD-10-CM | POA: Diagnosis not present

## 2018-05-06 DIAGNOSIS — I10 Essential (primary) hypertension: Secondary | ICD-10-CM | POA: Diagnosis not present

## 2018-05-06 DIAGNOSIS — G4733 Obstructive sleep apnea (adult) (pediatric): Secondary | ICD-10-CM | POA: Diagnosis not present

## 2018-05-06 DIAGNOSIS — E785 Hyperlipidemia, unspecified: Secondary | ICD-10-CM | POA: Diagnosis not present

## 2018-05-06 DIAGNOSIS — R002 Palpitations: Secondary | ICD-10-CM | POA: Diagnosis not present

## 2018-05-07 DIAGNOSIS — R002 Palpitations: Secondary | ICD-10-CM | POA: Diagnosis not present

## 2018-05-07 DIAGNOSIS — E785 Hyperlipidemia, unspecified: Secondary | ICD-10-CM | POA: Diagnosis not present

## 2018-05-07 DIAGNOSIS — I10 Essential (primary) hypertension: Secondary | ICD-10-CM | POA: Diagnosis not present

## 2018-05-07 DIAGNOSIS — I493 Ventricular premature depolarization: Secondary | ICD-10-CM | POA: Diagnosis not present

## 2018-05-09 DIAGNOSIS — Z6834 Body mass index (BMI) 34.0-34.9, adult: Secondary | ICD-10-CM | POA: Diagnosis not present

## 2018-05-09 DIAGNOSIS — M47816 Spondylosis without myelopathy or radiculopathy, lumbar region: Secondary | ICD-10-CM | POA: Diagnosis not present

## 2018-05-09 DIAGNOSIS — G5601 Carpal tunnel syndrome, right upper limb: Secondary | ICD-10-CM | POA: Diagnosis not present

## 2018-05-25 DIAGNOSIS — M531 Cervicobrachial syndrome: Secondary | ICD-10-CM | POA: Diagnosis not present

## 2018-05-25 DIAGNOSIS — M9901 Segmental and somatic dysfunction of cervical region: Secondary | ICD-10-CM | POA: Diagnosis not present

## 2018-05-25 DIAGNOSIS — M5137 Other intervertebral disc degeneration, lumbosacral region: Secondary | ICD-10-CM | POA: Diagnosis not present

## 2018-05-25 DIAGNOSIS — M5032 Other cervical disc degeneration, mid-cervical region, unspecified level: Secondary | ICD-10-CM | POA: Diagnosis not present

## 2018-05-28 DIAGNOSIS — M47816 Spondylosis without myelopathy or radiculopathy, lumbar region: Secondary | ICD-10-CM | POA: Diagnosis not present

## 2018-06-10 DIAGNOSIS — F3342 Major depressive disorder, recurrent, in full remission: Secondary | ICD-10-CM | POA: Diagnosis not present

## 2018-06-10 DIAGNOSIS — I10 Essential (primary) hypertension: Secondary | ICD-10-CM | POA: Diagnosis not present

## 2018-06-10 DIAGNOSIS — N401 Enlarged prostate with lower urinary tract symptoms: Secondary | ICD-10-CM | POA: Diagnosis not present

## 2018-06-10 DIAGNOSIS — E785 Hyperlipidemia, unspecified: Secondary | ICD-10-CM | POA: Diagnosis not present

## 2018-06-13 DIAGNOSIS — R81 Glycosuria: Secondary | ICD-10-CM | POA: Diagnosis not present

## 2018-06-14 DIAGNOSIS — G4733 Obstructive sleep apnea (adult) (pediatric): Secondary | ICD-10-CM | POA: Diagnosis not present

## 2018-06-28 DIAGNOSIS — M47816 Spondylosis without myelopathy or radiculopathy, lumbar region: Secondary | ICD-10-CM | POA: Diagnosis not present

## 2018-06-28 DIAGNOSIS — I10 Essential (primary) hypertension: Secondary | ICD-10-CM | POA: Diagnosis not present

## 2018-06-28 DIAGNOSIS — Z6835 Body mass index (BMI) 35.0-35.9, adult: Secondary | ICD-10-CM | POA: Diagnosis not present

## 2018-06-29 DIAGNOSIS — M5386 Other specified dorsopathies, lumbar region: Secondary | ICD-10-CM | POA: Diagnosis not present

## 2018-06-29 DIAGNOSIS — M531 Cervicobrachial syndrome: Secondary | ICD-10-CM | POA: Diagnosis not present

## 2018-06-29 DIAGNOSIS — M9904 Segmental and somatic dysfunction of sacral region: Secondary | ICD-10-CM | POA: Diagnosis not present

## 2018-06-29 DIAGNOSIS — M9903 Segmental and somatic dysfunction of lumbar region: Secondary | ICD-10-CM | POA: Diagnosis not present

## 2018-11-18 ENCOUNTER — Ambulatory Visit (HOSPITAL_COMMUNITY): Payer: Self-pay | Admitting: Psychiatry

## 2023-04-24 ENCOUNTER — Other Ambulatory Visit: Payer: Self-pay

## 2023-04-24 ENCOUNTER — Encounter (HOSPITAL_BASED_OUTPATIENT_CLINIC_OR_DEPARTMENT_OTHER): Payer: Self-pay | Admitting: Emergency Medicine

## 2023-04-24 DIAGNOSIS — Y30XXXA Falling, jumping or pushed from a high place, undetermined intent, initial encounter: Secondary | ICD-10-CM | POA: Diagnosis not present

## 2023-04-24 DIAGNOSIS — M25552 Pain in left hip: Secondary | ICD-10-CM | POA: Insufficient documentation

## 2023-04-24 DIAGNOSIS — Y93D9 Activity, other involving arts and handcrafts: Secondary | ICD-10-CM | POA: Insufficient documentation

## 2023-04-24 NOTE — ED Triage Notes (Signed)
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 Pt states he was 3 feet off the ground when he was trying to get down. Pt states he believes he landed on his left hip. Pt reports pain in his left hip, lower back, left shoulder, left hand and right hand. Pt denies hitting his head or LOC. - BT.

## 2023-04-25 ENCOUNTER — Emergency Department (HOSPITAL_BASED_OUTPATIENT_CLINIC_OR_DEPARTMENT_OTHER): Payer: Commercial Managed Care - PPO

## 2023-04-25 ENCOUNTER — Emergency Department (HOSPITAL_BASED_OUTPATIENT_CLINIC_OR_DEPARTMENT_OTHER)
Admission: EM | Admit: 2023-04-25 | Discharge: 2023-04-25 | Disposition: A | Discharge status: Home / Self Care | Payer: Commercial Managed Care - PPO | Attending: Emergency Medicine | Admitting: Emergency Medicine

## 2023-04-25 DIAGNOSIS — W19XXXA Unspecified fall, initial encounter: Secondary | ICD-10-CM

## 2023-04-25 MED ORDER — LIDOCAINE 5 % EX PTCH: 1.0000 | MEDICATED_PATCH | CUTANEOUS | 0 refills | Status: AC

## 2023-04-25 MED ORDER — KETOROLAC TROMETHAMINE 60 MG/2ML IM SOLN
30.0000 mg | Freq: Once | INTRAMUSCULAR | Status: AC
  Administered 2023-04-25: 30 mg via INTRAMUSCULAR
  Filled 2023-04-25: qty 2

## 2023-04-25 MED ORDER — MELOXICAM 15 MG PO TABS: 15.0000 mg | ORAL_TABLET | Freq: Every day | ORAL | 0 refills | Status: AC

## 2023-04-25 MED ORDER — LIDOCAINE 5 % EX PTCH
1.0000 | MEDICATED_PATCH | CUTANEOUS | Status: DC
  Administered 2023-04-25: 1 via TRANSDERMAL
  Filled 2023-04-25: qty 1

## 2023-04-25 NOTE — ED Provider Notes (Signed)
[facility] Provider Note   CSN: [id number] Arrival date & time: 04/24/23  2245     History  Chief Complaint  Patient presents with   Daniel Morrison    Daniel Morrison is a 52 y.o. male.  The history is provided by the patient.  Fall This is a new problem. The current episode started 12 to 24 hours ago. The problem occurs rarely. The problem has been resolved. Pertinent negatives include no chest pain, no abdominal pain, no headaches and no shortness of breath. Nothing aggravates the symptoms. Nothing relieves the symptoms. The treatment provided no relief.  Fell 3 feet onto left hip now with pain.       Home Medications Prior to Admission medications   Medication Sig Start Date End Date Taking? Authorizing Provider  lidocaine (LIDODERM) 5 % Place 1 patch onto the skin daily. Remove & Discard patch within 12 hours or as directed by MD 04/25/23  Yes Kardell Virgil, MD  meloxicam (MOBIC) 15 MG tablet Take 1 tablet (15 mg total) by mouth daily. 04/25/23  Yes Ciel Yanes, MD  ALPRAZolam Prudy Feeler) 0.25 MG tablet Take 0.25 mg by mouth daily as needed. For anxiety     [provider]  AMBULATORY NON FORMULARY MEDICATION CPAP at night    [provider]  buPROPion (WELLBUTRIN XL) 300 MG 24 hr tablet Take 300 mg by mouth daily.    [provider]  cyclobenzaprine (FLEXERIL) 10 MG tablet Take 1 tablet (10 mg total) by mouth 2 (two) times daily as needed for muscle spasms. 12/23/16   Fayrene Helper, PA-C  DULoxetine (CYMBALTA) 60 MG capsule Take 60 mg by mouth daily.    [provider]  ibuprofen (ADVIL,MOTRIN) 600 MG tablet Take 1 tablet (600 mg total) by mouth every 6 (six) hours as needed. 12/23/16   Fayrene Helper, PA-C  methocarbamol (ROBAXIN-750) 750 MG tablet Take 1 tablet (750 mg total) by mouth 4 (four) times daily. 05/02/14   Lorre Nick, MD  omeprazole (PRILOSEC) 40 MG capsule Take 1 capsule (40 mg total) by mouth  daily. 02/21/16 03/22/16  Rachael Fee, MD  ondansetron (ZOFRAN) 4 MG tablet Take 1 tablet (4 mg total) by mouth every 12 (twelve) hours as needed for nausea. 11/19/12   Rachael Fee, MD  oxyCODONE-acetaminophen (PERCOCET/ROXICET) 5-325 MG per tablet Take 1-2 tablets by mouth every 4 (four) hours as needed for severe pain. 05/02/14   Lorre Nick, MD  Testosterone (ANDROGEL PUMP) 20.25 MG/ACT (1.62%) GEL Place 2 application onto the skin daily.    [provider]      Allergies    Ciprofloxacin    Review of Systems   Review of Systems  Constitutional:  Negative for fever.  Respiratory:  Negative for shortness of breath.   Cardiovascular:  Negative for chest pain.  Gastrointestinal:  Negative for abdominal pain.  Genitourinary:  Negative for difficulty urinating.  Musculoskeletal:  Positive for arthralgias.  Neurological:  Negative for weakness, numbness and headaches.  All other systems reviewed and are negative.   Physical Exam Updated Vital Signs BP (!) 146/94   Pulse 67   Temp 97.7 F (36.5 C)   Resp 16   Ht 5\' 10"  (1.778 m)   Wt 93 kg   SpO2 94%   BMI 29.41 kg/m  Physical Exam Vitals and nursing note reviewed.  Constitutional:      General: He is not in acute distress.    Appearance: Normal  appearance. He is well-developed. He is not diaphoretic.  HENT:     Head: Normocephalic and atraumatic.     Nose: Nose normal.  Eyes:     Conjunctiva/sclera: Conjunctivae normal.     Pupils: Pupils are equal, round, and reactive to light.  Cardiovascular:     Rate and Rhythm: Normal rate and regular rhythm.     Pulses: Normal pulses.  Pulmonary:     Effort: Pulmonary effort is normal.     Breath sounds: Normal breath sounds. No wheezing or rales.  Abdominal:     General: Bowel sounds are normal.     Palpations: Abdomen is soft.     Tenderness: There is no abdominal tenderness. There is no guarding or rebound.  Musculoskeletal:        General: Normal  range of motion.     Cervical back: Normal range of motion and neck supple. No tenderness.     Thoracic back: Normal.     Lumbar back: Normal.     Left hip: Normal.     Left upper leg: Normal.     Left knee: Normal.     Right ankle: Normal.     Right Achilles Tendon: Normal.     Left ankle: Normal.     Left Achilles Tendon: Normal.  Skin:    General: Skin is warm and dry.     Capillary Refill: Capillary refill takes less than 2 seconds.  Neurological:     General: No focal deficit present.     Mental Status: He is alert and oriented to person, place, and time.     ED Results / Procedures / Treatments   Labs (all labs ordered are listed, but only abnormal results are displayed) Labs Reviewed - No data to display  EKG None  Radiology DG Hip Unilat W or Wo Pelvis 2-3 Views Left Result Date: 04/25/2023 CLINICAL DATA:  Larey Seat 3 feet off the scaffold onto ground hurting left hip EXAM: DG HIP (WITH OR WITHOUT PELVIS) 2-3V LEFT COMPARISON:  08/02/2022 FINDINGS: No acute fracture or dislocation. Degenerative changes pubic symphysis, both hips, SI joints and lower lumbar spine. IMPRESSION: No acute fracture or dislocation. Electronically Signed   By: Minerva Fester M.D.   On: 04/25/2023 00:55    Procedures Procedures    Medications Ordered in ED Medications  lidocaine (LIDODERM) 5 % 1 patch (1 patch Transdermal Patch Applied 04/25/23 0247)  ketorolac (TORADOL) injection 30 mg (30 mg Intramuscular Given 04/25/23 0247)    ED Course/ Medical Decision Making/ A&P                                 Medical Decision Making Patient fell 3 feet onto left hip   Amount and/or Complexity of Data Reviewed Independent Historian: spouse    Details: See above  External Data Reviewed: notes.    Details: Previous notes reviewed  Radiology: ordered and independent interpretation performed.    Details: Negative left hip   Risk Prescription drug management. Risk Details: Well appearing, no  deformity or bruising. FROM.  Normal imaging.  Stable for discharge.  Strict return.     Final Clinical Impression(s) / ED Diagnoses Final diagnoses:  Fall, initial encounter   Return for intractable cough, coughing up blood, fevers > 100.4 unrelieved by medication, shortness of breath, intractable vomiting, chest pain, shortness of breath, weakness, numbness, changes in speech, facial asymmetry, abdominal pain, passing out, Inability to  tolerate liquids or food, cough, altered mental status or any concerns. No signs of systemic illness or infection. The patient is nontoxic-appearing on exam and vital signs are within normal limits.  I have reviewed the triage vital signs and the nursing notes. Pertinent labs & imaging results that were available during my care of the patient were reviewed by me and considered in my medical decision making (see chart for details). After history, exam, and medical workup I feel the patient has been appropriately medically screened and is safe for discharge home. Pertinent diagnoses were discussed with the patient. Patient was given return precautions.  Rx / DC Orders ED Discharge Orders          Ordered    meloxicam (MOBIC) 15 MG tablet  Daily        04/25/23 0305    lidocaine (LIDODERM) 5 %  Every 24 hours        04/25/23 0305              Sahily Biddle, MD 04/25/23 1610
